# Patient Record
Sex: Female | Born: 1935 | Race: White | Hispanic: No | Marital: Married | Smoking: Never smoker
Health system: Southern US, Community
[De-identification: ages and names within clinical notes are randomized; demographics above are authoritative.]

## PROBLEM LIST (undated history)

## (undated) DIAGNOSIS — I10 Essential (primary) hypertension: Secondary | ICD-10-CM

## (undated) DIAGNOSIS — E079 Disorder of thyroid, unspecified: Secondary | ICD-10-CM

## (undated) DIAGNOSIS — E039 Hypothyroidism, unspecified: Secondary | ICD-10-CM

## (undated) DIAGNOSIS — K219 Gastro-esophageal reflux disease without esophagitis: Secondary | ICD-10-CM

## (undated) DIAGNOSIS — F419 Anxiety disorder, unspecified: Secondary | ICD-10-CM

## (undated) DIAGNOSIS — M1990 Unspecified osteoarthritis, unspecified site: Secondary | ICD-10-CM

## (undated) DIAGNOSIS — E7800 Pure hypercholesterolemia, unspecified: Secondary | ICD-10-CM

## (undated) DIAGNOSIS — I6529 Occlusion and stenosis of unspecified carotid artery: Secondary | ICD-10-CM

## (undated) DIAGNOSIS — I639 Cerebral infarction, unspecified: Secondary | ICD-10-CM

## (undated) HISTORY — PX: COLONOSCOPY: SHX174

## (undated) HISTORY — DX: Occlusion and stenosis of unspecified carotid artery: I65.29

## (undated) HISTORY — PX: ROTATOR CUFF REPAIR: SHX139

## (undated) HISTORY — PX: MENISCUS REPAIR: SHX5179

## (undated) HISTORY — PX: APPENDECTOMY: SHX54

## (undated) HISTORY — DX: Cerebral infarction, unspecified: I63.9

---

## 2001-09-02 ENCOUNTER — Encounter (HOSPITAL_COMMUNITY): Admission: RE | Admit: 2001-09-02 | Discharge: 2001-10-02 | Payer: Self-pay | Admitting: Oncology

## 2001-09-02 ENCOUNTER — Encounter: Admission: RE | Admit: 2001-09-02 | Discharge: 2001-09-02 | Payer: Self-pay | Admitting: Oncology

## 2001-11-30 ENCOUNTER — Encounter (HOSPITAL_COMMUNITY): Admission: RE | Admit: 2001-11-30 | Discharge: 2001-12-30 | Payer: Self-pay | Admitting: Oncology

## 2002-03-24 ENCOUNTER — Encounter: Admission: RE | Admit: 2002-03-24 | Discharge: 2002-03-24 | Payer: Self-pay | Admitting: Oncology

## 2002-03-24 ENCOUNTER — Encounter (HOSPITAL_COMMUNITY): Admission: RE | Admit: 2002-03-24 | Discharge: 2002-04-23 | Payer: Self-pay | Admitting: Oncology

## 2002-09-28 ENCOUNTER — Encounter (HOSPITAL_COMMUNITY): Admission: RE | Admit: 2002-09-28 | Discharge: 2002-10-07 | Payer: Self-pay | Admitting: Oncology

## 2002-09-28 ENCOUNTER — Encounter: Admission: RE | Admit: 2002-09-28 | Discharge: 2002-09-28 | Payer: Self-pay | Admitting: Oncology

## 2003-10-04 ENCOUNTER — Ambulatory Visit (HOSPITAL_COMMUNITY): Admission: RE | Admit: 2003-10-04 | Discharge: 2003-10-04 | Payer: Self-pay

## 2004-11-26 ENCOUNTER — Ambulatory Visit: Payer: Self-pay | Admitting: Internal Medicine

## 2004-11-26 ENCOUNTER — Ambulatory Visit (HOSPITAL_COMMUNITY): Admission: RE | Admit: 2004-11-26 | Discharge: 2004-11-26 | Payer: Self-pay | Admitting: Internal Medicine

## 2005-07-22 ENCOUNTER — Ambulatory Visit (HOSPITAL_COMMUNITY): Admission: RE | Admit: 2005-07-22 | Discharge: 2005-07-22 | Payer: Self-pay | Admitting: Pulmonary Disease

## 2005-08-14 ENCOUNTER — Ambulatory Visit (HOSPITAL_COMMUNITY): Admission: RE | Admit: 2005-08-14 | Discharge: 2005-08-14 | Payer: Self-pay | Admitting: Pulmonary Disease

## 2005-08-20 ENCOUNTER — Ambulatory Visit (HOSPITAL_COMMUNITY): Admission: RE | Admit: 2005-08-20 | Discharge: 2005-08-20 | Payer: Self-pay | Admitting: Pulmonary Disease

## 2008-05-31 ENCOUNTER — Ambulatory Visit (HOSPITAL_COMMUNITY): Admission: RE | Admit: 2008-05-31 | Discharge: 2008-05-31 | Payer: Self-pay | Admitting: Orthopaedic Surgery

## 2008-07-13 ENCOUNTER — Encounter (HOSPITAL_COMMUNITY): Admission: RE | Admit: 2008-07-13 | Discharge: 2008-08-12 | Payer: Self-pay | Admitting: Orthopaedic Surgery

## 2009-06-08 ENCOUNTER — Ambulatory Visit (HOSPITAL_COMMUNITY): Admission: RE | Admit: 2009-06-08 | Discharge: 2009-06-08 | Payer: Self-pay | Admitting: Pulmonary Disease

## 2010-05-25 NOTE — Op Note (Signed)
NAME:  Sarah Nelson, Sarah Nelson           ACCOUNT NO.:  [id number]   MEDICAL RECORD NO.:  [id number]          PATIENT TYPE:  AMB   LOCATION:  DAY                           FACILITY:  [facility]   PHYSICIAN:  R. Roetta Sessions, M.D. DATE OF BIRTH:  1935-06-28   DATE OF PROCEDURE:  11/26/2004  DATE OF DISCHARGE:                                 OPERATIVE REPORT   PROCEDURE:  Screening colonoscopy.   INDICATIONS FOR PROCEDURE:  The patient is a 75 year old lady sent over at  the courtesy of Dr. Juanetta Gosling for colorectal cancer screening. She had a  sigmoidoscopy in 2001 with negative findings. She is devoid of any lower GI  tract symptoms. She has never had her entire lower GI tract imaged. There is  no family history of colorectal cancer in any first degree relatives  although she does have a first colon reportedly with colorectal cancer.  Colonoscopy is now being done as a standard screening maneuver. This  approach has been discussed with the patient at length. Potential risks,  benefits, and alternatives have been reviewed and questions answered. She is  agreeable. Please see documentation in the medical record.   PROCEDURE NOTE:  O2 saturation, blood pressure, pulse, and respirations were  monitored throughout the entire procedure. Conscious sedation with Versed 3  mg IV and Demerol 75 mg IV in divided doses.   INSTRUMENT:  Olympus video chip system.   FINDINGS:  Digital rectal exam revealed no abnormalities.   ENDOSCOPIC FINDINGS:  Prep was good.   Rectum:  Examination of the rectal mucosa including retroflexed view of the  anal verge revealed only a couple of internal hemorrhoidal tags. Rectal  mucosa otherwise appeared normal.   Colon:  Colonic mucosa was surveyed from the rectosigmoid junction through  the left, transverse, and right colon to the area of the appendiceal  orifice, ileocecal valve, and cecum. These structures were well seen and  photographed for the record. From this level,  the scope was slowly  withdrawn, and all previously mentioned mucosal surfaces were again seen.  The patient had a few scattered left colon diverticula. The remainder of the  colonic mucosa appeared normal. The patient tolerated the procedure well and  was reactive to endoscopy.   IMPRESSION:  1.  Internal hemorrhoids. Otherwise normal rectum.  2.  Few scattered left sided diverticula. The remainder of the colonic      mucosa appeared normal.   RECOMMENDATIONS:  1.  Diverticulosis literature given to [name].  2.  Repeat screening colonoscopy in 10 years.      Jonathon Bellows, M.D.  Electronically Signed     RMR/MEDQ  D:  11/26/2004  T:  11/26/2004  Job:  [id number]   cc:   Ramon Dredge L. Juanetta Gosling, M.D.  Fax: [phone]

## 2012-11-19 ENCOUNTER — Other Ambulatory Visit (INDEPENDENT_AMBULATORY_CARE_PROVIDER_SITE_OTHER): Payer: Self-pay | Admitting: Otolaryngology

## 2012-11-19 DIAGNOSIS — H905 Unspecified sensorineural hearing loss: Secondary | ICD-10-CM

## 2012-11-23 ENCOUNTER — Ambulatory Visit (HOSPITAL_COMMUNITY)
Admission: RE | Admit: 2012-11-23 | Discharge: 2012-11-23 | Disposition: A | Discharge status: Home / Self Care | Payer: Medicare Other | Source: Ambulatory Visit | Attending: Otolaryngology | Admitting: Otolaryngology

## 2012-11-23 DIAGNOSIS — H905 Unspecified sensorineural hearing loss: Secondary | ICD-10-CM

## 2012-11-23 DIAGNOSIS — R42 Dizziness and giddiness: Secondary | ICD-10-CM | POA: Insufficient documentation

## 2012-11-23 DIAGNOSIS — H9190 Unspecified hearing loss, unspecified ear: Secondary | ICD-10-CM | POA: Insufficient documentation

## 2012-11-23 MED ORDER — GADOBENATE DIMEGLUMINE 529 MG/ML IV SOLN
7.0000 mL | Freq: Once | INTRAVENOUS | Status: AC | PRN
  Administered 2012-11-23: 7 mL via INTRAVENOUS

## 2012-11-24 LAB — POCT I-STAT, CHEM 8
Chloride: 103 mEq/L (ref 96–112)
HCT: 45 % (ref 36.0–46.0)
Potassium: 3.8 mEq/L (ref 3.5–5.1)
TCO2: 27 mmol/L (ref 0–100)

## 2012-12-17 ENCOUNTER — Encounter (INDEPENDENT_AMBULATORY_CARE_PROVIDER_SITE_OTHER): Payer: Self-pay

## 2012-12-17 ENCOUNTER — Ambulatory Visit (INDEPENDENT_AMBULATORY_CARE_PROVIDER_SITE_OTHER): Payer: Medicare Other | Admitting: Otolaryngology

## 2012-12-17 DIAGNOSIS — H905 Unspecified sensorineural hearing loss: Secondary | ICD-10-CM

## 2012-12-17 DIAGNOSIS — R42 Dizziness and giddiness: Secondary | ICD-10-CM

## 2012-12-17 DIAGNOSIS — H8110 Benign paroxysmal vertigo, unspecified ear: Secondary | ICD-10-CM

## 2013-01-14 ENCOUNTER — Ambulatory Visit (INDEPENDENT_AMBULATORY_CARE_PROVIDER_SITE_OTHER): Payer: Managed Care, Other (non HMO) | Admitting: Otolaryngology

## 2013-01-14 DIAGNOSIS — H8110 Benign paroxysmal vertigo, unspecified ear: Secondary | ICD-10-CM

## 2014-10-18 DIAGNOSIS — H52203 Unspecified astigmatism, bilateral: Secondary | ICD-10-CM | POA: Diagnosis not present

## 2014-10-18 DIAGNOSIS — H524 Presbyopia: Secondary | ICD-10-CM | POA: Diagnosis not present

## 2014-10-19 ENCOUNTER — Telehealth: Payer: Self-pay | Admitting: Internal Medicine

## 2014-10-19 NOTE — Telephone Encounter (Signed)
NOV RECALL FOR TCS °

## 2014-10-19 NOTE — Telephone Encounter (Signed)
Letter mailed to pt.  

## 2014-11-10 DIAGNOSIS — R69 Illness, unspecified: Secondary | ICD-10-CM | POA: Diagnosis not present

## 2015-07-18 DIAGNOSIS — R69 Illness, unspecified: Secondary | ICD-10-CM | POA: Diagnosis not present

## 2015-07-24 DIAGNOSIS — E785 Hyperlipidemia, unspecified: Secondary | ICD-10-CM | POA: Diagnosis not present

## 2015-07-24 DIAGNOSIS — E039 Hypothyroidism, unspecified: Secondary | ICD-10-CM | POA: Diagnosis not present

## 2015-07-24 DIAGNOSIS — R42 Dizziness and giddiness: Secondary | ICD-10-CM | POA: Diagnosis not present

## 2015-08-03 DIAGNOSIS — Z0000 Encounter for general adult medical examination without abnormal findings: Secondary | ICD-10-CM | POA: Diagnosis not present

## 2015-08-08 DIAGNOSIS — Z1211 Encounter for screening for malignant neoplasm of colon: Secondary | ICD-10-CM | POA: Diagnosis not present

## 2015-10-30 ENCOUNTER — Other Ambulatory Visit (HOSPITAL_COMMUNITY): Payer: Self-pay | Admitting: Pulmonary Disease

## 2015-10-30 ENCOUNTER — Ambulatory Visit (HOSPITAL_COMMUNITY)
Admission: RE | Admit: 2015-10-30 | Discharge: 2015-10-30 | Disposition: A | Discharge status: Home / Self Care | Payer: Medicare HMO | Source: Ambulatory Visit | Attending: Pulmonary Disease | Admitting: Pulmonary Disease

## 2015-10-30 DIAGNOSIS — M545 Low back pain: Secondary | ICD-10-CM

## 2015-10-30 DIAGNOSIS — I700 Atherosclerosis of aorta: Secondary | ICD-10-CM | POA: Insufficient documentation

## 2015-11-02 DIAGNOSIS — I739 Peripheral vascular disease, unspecified: Secondary | ICD-10-CM | POA: Diagnosis not present

## 2015-11-02 DIAGNOSIS — M545 Low back pain: Secondary | ICD-10-CM | POA: Diagnosis not present

## 2015-11-02 DIAGNOSIS — E785 Hyperlipidemia, unspecified: Secondary | ICD-10-CM | POA: Diagnosis not present

## 2015-11-02 DIAGNOSIS — Z23 Encounter for immunization: Secondary | ICD-10-CM | POA: Diagnosis not present

## 2015-11-21 DIAGNOSIS — H524 Presbyopia: Secondary | ICD-10-CM | POA: Diagnosis not present

## 2015-11-21 DIAGNOSIS — H2513 Age-related nuclear cataract, bilateral: Secondary | ICD-10-CM | POA: Diagnosis not present

## 2015-11-21 DIAGNOSIS — H1859 Other hereditary corneal dystrophies: Secondary | ICD-10-CM | POA: Diagnosis not present

## 2015-11-21 DIAGNOSIS — H43813 Vitreous degeneration, bilateral: Secondary | ICD-10-CM | POA: Diagnosis not present

## 2016-01-08 HISTORY — PX: OTHER SURGICAL HISTORY: SHX169

## 2016-05-10 ENCOUNTER — Other Ambulatory Visit (HOSPITAL_COMMUNITY): Payer: Self-pay | Admitting: Orthopedic Surgery

## 2016-05-10 DIAGNOSIS — M25561 Pain in right knee: Secondary | ICD-10-CM

## 2016-05-14 ENCOUNTER — Ambulatory Visit (HOSPITAL_COMMUNITY)
Admission: RE | Admit: 2016-05-14 | Discharge: 2016-05-14 | Disposition: A | Discharge status: Home / Self Care | Payer: Medicare HMO | Source: Ambulatory Visit | Attending: Orthopedic Surgery | Admitting: Orthopedic Surgery

## 2016-05-14 DIAGNOSIS — M25561 Pain in right knee: Secondary | ICD-10-CM | POA: Insufficient documentation

## 2016-05-14 DIAGNOSIS — X58XXXA Exposure to other specified factors, initial encounter: Secondary | ICD-10-CM | POA: Diagnosis not present

## 2016-05-14 DIAGNOSIS — S83241A Other tear of medial meniscus, current injury, right knee, initial encounter: Secondary | ICD-10-CM | POA: Insufficient documentation

## 2016-05-14 DIAGNOSIS — M7121 Synovial cyst of popliteal space [Baker], right knee: Secondary | ICD-10-CM | POA: Insufficient documentation

## 2016-05-14 DIAGNOSIS — M25461 Effusion, right knee: Secondary | ICD-10-CM | POA: Insufficient documentation

## 2016-05-27 ENCOUNTER — Ambulatory Visit (HOSPITAL_COMMUNITY): Payer: Medicare HMO | Attending: Orthopedic Surgery | Admitting: Physical Therapy

## 2016-05-27 DIAGNOSIS — M25661 Stiffness of right knee, not elsewhere classified: Secondary | ICD-10-CM | POA: Diagnosis not present

## 2016-05-27 DIAGNOSIS — M25561 Pain in right knee: Secondary | ICD-10-CM | POA: Diagnosis present

## 2016-05-27 NOTE — Therapy (Signed)
[facility] [address] [place], [place], [address] Phone: [phone]   Fax:  [phone]  Physical Therapy Evaluation  Patient Details  Name: Sarah Nelson MRN: [id number] Date of Birth: [birth year] Referring Provider: Darcella CheshireSteven Lucey   Encounter Date: 05/27/2016      Sarah End of Session - 05/27/16 1420    Visit Number 1   Number of Visits 4   Date for Sarah Re-Evaluation 06/11/16   Authorization Type Aetna medicare   Authorization - Visit Number 1   Authorization - Number of Visits 4   Sarah Start Time 1345   Sarah Stop Time 1420   Sarah Time Calculation (min) 35 min   Activity Tolerance Patient tolerated treatment well   Behavior During Therapy [facility] for tasks assessed/performed      No past medical history on file.  No past surgical history on file.  There were no vitals filed for this visit.       Subjective Assessment - 05/27/16 1348    Subjective Sarah Nelson states that she injured her knee playing tennis in February; she opted to have arthroscopic knee surgery on her right side on 05/20/2016.  She states that she does not feel much different; she is still having the pain with sit to stand and if she turns and going up and down steps.  She is now being referred to skilled physical therapy to maximize her functional ability.    Pertinent History unremarkable    How long can you sit comfortably? no problem    How long can you stand comfortably? 20 minutes    How long can you walk comfortably? 10 minutes at a time.    Patient Stated Goals to play tennis again    Currently in Pain? No/denies  sit to stand pain is a 5/10    Aggravating Factors  she has pain with sit to stand and steps    Pain Relieving Factors ice    Effect of Pain on Daily Activities increases             [facility] Assessment - 05/27/16 0001      Assessment   Medical Diagnosis Rt knee arthroscopic surgery    Referring Provider Darcella CheshireSteven Lucey    Onset  Date/Surgical Date 05/20/16   Next MD Visit 06/18/2016   Prior Therapy no     Precautions   Precautions None     Restrictions   Weight Bearing Restrictions No     Balance Screen   Has the patient fallen in the past 6 months No   Has the patient had a decrease in activity level because of a fear of falling?  Yes   Is the patient reluctant to leave their home because of a fear of falling?  No     Home [employer] information [facility] managernvironment   Living Environment Private residence     Prior Function   Level of Independence Independent   Vocation Retired   Leisure tennis     Cognition   Overall Cognitive Status Within Functional Limits for tasks assessed     Observation/Other Assessments   Focus on Therapeutic Outcomes (FOTO)  42     Functional Tests   Functional tests Single leg stance;Sit to Stand     Single Leg Stance   Comments Lt: 3 seconds: Rt 19 seconds      Sit to Stand   Comments 5 x 26.48 pain with initial sit to stand but none after.  ROM / Strength   AROM / PROM / Strength AROM;Strength     AROM   AROM Assessment Site Knee   Right/Left Knee Right   Right Knee Extension 0   Right Knee Flexion 120     Strength   Strength Assessment Site Hip;Knee;Ankle   Right/Left Hip Right   Right Hip Flexion 5/5   Right Hip Extension 5/5   Right Hip ABduction 5/5   Right/Left Knee Right   Right Knee Flexion 5/5   Right Knee Extension 5/5   Right/Left Ankle Right   Right Ankle Dorsiflexion 4/5   Right Ankle Plantar Flexion 4/5     Flexibility   Soft Tissue Assessment /Muscle Length yes   Hamstrings Rt 145; LT 160                   [facility] Adult Sarah Treatment/Exercise - 05/27/16 0001      Exercises   Exercises Knee/Hip     Knee/Hip Exercises: Stretches   Active Hamstring Stretch Both;3 reps;30 seconds     Knee/Hip Exercises: Standing   Heel Raises Both;10 reps   Functional Squat 10 reps                Sarah Education - 05/27/16 1419    Education provided Yes    Education Details HEP   Person(s) Educated Patient   Methods Explanation;Handout;Verbal cues   Comprehension Verbalized understanding;Returned demonstration          Sarah Short Term Goals - 05/27/16 1427      Sarah SHORT TERM GOAL #1   Title Sarah pain level iin her right knee  to be no greater than a 3/10 to allow Sarah to feel comfortable being up for an hour.    Time 1   Period Weeks   Status New     Sarah SHORT TERM GOAL #2   Title Sarah to be able to come sit to stand from the couch without increase pain in her right knee   Time 1   Period Weeks     Sarah SHORT TERM GOAL #3   Title Sarah to be able to ascend and descend steps without increased right knee pain    Time 10   Period Days   Status New           Sarah Long Term Goals - 05/27/16 1429      Sarah LONG TERM GOAL #1   Title Sarah to be able to go to the tennis courts and volley balls for 45 minutes without having increased right knee pain.    Time 2   Period Weeks   Status New     Sarah LONG TERM GOAL #2   Title Sarah to be able to be up on her feet standing/walking for two hours without experiencing increased right knee pain.    Time 2   Period Weeks   Status New               Plan - 05/27/16 1421    Clinical Impression Statement Sarah Nelson is a 81 yo female who injured her right knee while playing tennis in February.  Her knee pain did not improve therefore she opted to have arthroscopic surgery on 05/20/2016.  At this time her functional level remains the same therefore she has been referred to skilled outpatient physical therapy.  Examination demonstrates decreased ROM, decreased balance, decreased flexibility, decreased strength and increased pain.  Sarah Nelson will benefit from skilled therapy  to address these issues and maximize her functional ability.     Rehab Potential Good   Sarah Frequency 2x / week   Sarah Duration 2 weeks   Sarah Treatment/Interventions ADLs/Self Care Home Management;Therapeutic activities;Functional  mobility training;Stair training;Gait training;Therapeutic exercise;Balance training;Patient/family education;Passive range of motion;Manual techniques   Sarah Next Visit Plan Begin rockerboard, sit to stand, forward and lateral step ups and single leg stance.  Progress to steps, vector stances .  Update HEP each visit.    Sarah Home Exercise Plan heel raises, functional squat; supine hamstring stretch,       Patient will benefit from skilled therapeutic intervention in order to improve the following deficits and impairments:  Decreased activity tolerance, Decreased balance, Decreased strength, Decreased range of motion, Difficulty walking, Impaired flexibility, Pain  Visit Diagnosis: Stiffness of right knee, not elsewhere classified  Acute pain of right knee      G-Codes - 2016-06-21 1431    Functional Assessment Tool Used (Outpatient Only) foto   Functional Limitation Mobility: Walking and moving around   Mobility: Walking and Moving Around Current Status [phone] At least 40 percent but less than 60 percent impaired, limited or restricted   Mobility: Walking and Moving Around Goal Status [phone]) At least 20 percent but less than 40 percent impaired, limited or restricted       Problem List There are no active problems to display for this patient.   Sarah Nelson, Sarah Nelson [phone] 06-21-16, 2:32 PM  [facility] [address] [place], [place], [address] Phone: [phone]   Fax:  [phone]  Name: Sarah Nelson MRN: [id number] Date of Birth: 17-May-1935

## 2016-05-27 NOTE — Patient Instructions (Addendum)
Stretching: Hamstring (Supine)    Supporting right thigh behind knee, slowly straighten knee until stretch is felt in back of thigh. Hold 30___ seconds. Repeat __3__ times per set. Do _1___ sets per session. Do _1-2___ sessions per day.  http://orth.exer.us/656   Copyright  VHI. All rights reserved.  Self-Mobilization: Heel Slide (Supine)    Slide left heel toward buttocks until a gentle stretch is felt. Hold __3-5__ seconds. Relax. Repeat __5-10__ times per set. Do _1___ sets per session. Do _2___ sessions per day.  http://orth.exer.us/710   Copyright  VHI. All rights reserved.  Heel Raise: Bilateral (Standing)    Rise on balls of feet. Repeat __10__ times per set. Do __1__ sets per session. Do ___2_ sessions per day.  http://orth.exer.us/38   Copyright  VHI. All rights reserved.  Functional Quadriceps: Chair Squat    Keeping feet flat on floor, shoulder width apart, squat as low as is comfortable. Use support as necessary. Repeat __10__ times per set. Do _1___ sets per session. Do __2__ sessions per day.  http://orth.exer.us/736   Copyright  VHI. All rights reserved.

## 2016-05-30 ENCOUNTER — Ambulatory Visit (HOSPITAL_COMMUNITY): Payer: Medicare HMO

## 2016-05-30 DIAGNOSIS — M25561 Pain in right knee: Secondary | ICD-10-CM

## 2016-05-30 DIAGNOSIS — M25661 Stiffness of right knee, not elsewhere classified: Secondary | ICD-10-CM

## 2016-05-30 NOTE — Therapy (Signed)
[facility] [address] [place], [place], [address] Phone: [phone]   Fax:  [phone]  Physical Therapy Treatment  Patient Details  Name: Sarah Nelson MRN: [id number] Date of Birth: 12/20/1935 Referring Provider: Darcella Cheshire   Encounter Date: 05/30/2016      PT End of Session - 05/30/16 1350    Visit Number 2   Number of Visits 4   Date for PT Re-Evaluation 06/11/16   Authorization Type Aetna medicare   Authorization - Visit Number 2   Authorization - Number of Visits 4   PT Start Time 1345   PT Stop Time 1433   PT Time Calculation (min) 48 min   Activity Tolerance Patient tolerated treatment well;No increased pain   Behavior During Therapy [facility] for tasks assessed/performed      No past medical history on file.  No past surgical history on file.  There were no vitals filed for this visit.      Subjective Assessment - 05/30/16 1349    Subjective Pt reports no reports of pain, mainly stiffness.  Reports complaince wiht HEP without difficulty.   Pertinent History unremarkable    Patient Stated Goals to play tennis again    Currently in Pain? No/denies                         [facility] Adult PT Treatment/Exercise - 05/30/16 0001      Knee/Hip Exercises: Stretches   Active Hamstring Stretch Both;3 reps;30 seconds   Active Hamstring Stretch Limitations supine with towel     Knee/Hip Exercises: Standing   Heel Raises Both;2 sets;10 reps   Lateral Step Up Both;10 reps;Hand Hold: 1;Step Height: 4";2 sets;Step Height: 6"   Lateral Step Up Limitations 4in then attempted 6in   Forward Step Up Both;10 reps;Hand Hold: 0;Step Height: 6"   Functional Squat 10 reps   Functional Squat Limitations cueing for form   Rocker Board 2 minutes   Rocker Board Limitations lateral and DF/PF   SLS Lt 13", Rt 14" max of 3     Knee/Hip Exercises: Seated   Sit to Sand 10 reps;without UE support;5 reps  no HHA,  cueing for eccentric control from 19.5in and 16.25in     Knee/Hip Exercises: Supine   Heel Slides 10 reps   Heel Slides Limitations reviewed form with HEP                PT Education - 05/30/16 1358    Education provided Yes   Education Details Reviewed goals, assured complaince and proper technique wiht HEP and copy of eval given to pt   Person(s) Educated Patient   Methods Explanation;Demonstration;Handout   Comprehension Verbalized understanding;Returned demonstration;Verbal cues required          PT Short Term Goals - 05/27/16 1427      PT SHORT TERM GOAL #1   Title PT pain level iin her right knee  to be no greater than a 3/10 to allow pt to feel comfortable being up for an hour.    Time 1   Period Weeks   Status New     PT SHORT TERM GOAL #2   Title Pt to be able to come sit to stand from the couch without increase pain in her right knee   Time 1   Period Weeks     PT SHORT TERM GOAL #3   Title Pt to be able to ascend and  descend steps without increased right knee pain    Time 10   Period Days   Status New           PT Long Term Goals - 05/27/16 1429      PT LONG TERM GOAL #1   Title Pt to be able to go to the tennis courts and volley balls for 45 minutes without having increased right knee pain.    Time 2   Period Weeks   Status New     PT LONG TERM GOAL #2   Title Pt to be able to be up on her feet standing/walking for two hours without experiencing increased right knee pain.    Time 2   Period Weeks   Status New               Plan - 05/30/16 1444    Clinical Impression Statement Reviewed goals, assured compliance and proper form with HEP and copy of eval given to pt.  Progressed CKC exercises for functional strengthening, pt able to demonstrate good form and techniques with min cueing for appropriate weight bearing with squats, able to demonstrate proper form following cueing.  Pt given additional HEP for functional strengthening  including eccentric sit to stand, forward step ups and SLS.  Pt unable to complete lateral step ups without compensation with increased height, not given as HEP this session.  No reports of pain through session.  Reviewed RICE technqiues to address increased pain and edema following prolonged standing and therex, verbally understood.     Rehab Potential Good   PT Frequency 2x / week   PT Duration 2 weeks   PT Treatment/Interventions ADLs/Self Care Home Management;Therapeutic activities;Functional mobility training;Stair training;Gait training;Therapeutic exercise;Balance training;Patient/family education;Passive range of motion;Manual techniques   PT Next Visit Plan Next session continue wiht SLS and lateral step ups, increase height as able, begin step down next session.  Progress to reciprocal stair training and vector stance.  Update HEP each visit.     PT Home Exercise Plan heel raises, functional squat; supine hamstring stretch; sit to stand, step up and SLS.      Patient will benefit from skilled therapeutic intervention in order to improve the following deficits and impairments:  Decreased activity tolerance, Decreased balance, Decreased strength, Decreased range of motion, Difficulty walking, Impaired flexibility, Pain  Visit Diagnosis: Stiffness of right knee, not elsewhere classified  Acute pain of right knee     Problem List There are no active problems to display for this patient.  [address] Rockaway StreetCasey Cockerham, LPTA; CBIS [phone]  Juel BurrowCockerham, Casey Jo 05/30/2016, 2:52 PM  [facility] [address] [place], [place], [address] Phone: [phone]   Fax:  [phone]  Name: Laurice RecordHazel D Hoogendoorn MRN: [id number] Date of Birth: 1935-03-13

## 2016-05-30 NOTE — Patient Instructions (Addendum)
Functional Quadriceps: Sit to Stand    Sit on edge of chair, feet flat on floor. Stand upright, extending knees fully. Repeat 10 times per set. Do 2 sets per session.   http://orth.exer.us/735   Copyright  VHI. All rights reserved.   Step: Up, Anterior    Stand facing step. Place involved leg up. Raise body using top leg only. Step down backward, involved leg first, lower body using other leg. Repeat 10 times per set. Do 2 sets per session.   Copyright  VHI. All rights reserved.   Single Leg Balance: Eyes Open    Stand on right leg with eyes open. Hold 30-60 seconds. 5 reps 2 times per day.  http://ggbe.exer.us/5   Copyright  VHI. All rights reserved.

## 2016-06-04 ENCOUNTER — Ambulatory Visit (HOSPITAL_COMMUNITY): Payer: Medicare HMO

## 2016-06-04 DIAGNOSIS — M25561 Pain in right knee: Secondary | ICD-10-CM

## 2016-06-04 DIAGNOSIS — M25661 Stiffness of right knee, not elsewhere classified: Secondary | ICD-10-CM | POA: Diagnosis not present

## 2016-06-04 NOTE — Patient Instructions (Addendum)
Step: Up, Lateral    Stand with side toward step. Place involved leg up. Raise body using top leg only. Step off other side, involved leg first, lower body using other leg. Repeat 10 times per set. Do 2 sets per session.  Copyright  VHI. All rights reserved.   Climbing Stairs    When climbing stairs, breathe in with first step, breathe out through pursed lips with two or three steps. If you are more short of breath than usual: Breathe in while standing still. Breathe out through pursed lips while stepping up. Stop, then repeat with each step. Take only one step at a time. Do not hold your breath when climbing steps.  Copyright  VHI. All rights reserved.

## 2016-06-04 NOTE — Therapy (Signed)
[facility] [address] [place], [place], [address] Phone: [phone]   Fax:  [phone]  Physical Therapy Treatment  Patient Details  Name: Sarah Nelson MRN: [id number] Date of Birth: 1935/12/23 Referring Provider: Darcella CheshireSteven Lucey   Encounter Date: 06/04/2016      PT End of Session - 06/04/16 1353    Visit Number 3   Number of Visits 4   Date for PT Re-Evaluation 06/11/16   Authorization Type Aetna medicare   Authorization - Visit Number 3   Authorization - Number of Visits 4   PT Start Time 1350   PT Stop Time 1428   PT Time Calculation (min) 38 min   Activity Tolerance Patient tolerated treatment well;No increased pain   Behavior During Therapy [facility] for tasks assessed/performed      No past medical history on file.  No past surgical history on file.  There were no vitals filed for this visit.      Subjective Assessment - 06/04/16 1351    Subjective No reports of pain, mainly stiffness.  Reports compliance wiht HEP daily, stated ability to SLS 30" this morning   Pertinent History unremarkable    Patient Stated Goals to play tennis again    Currently in Pain? No/denies                         [facility] Adult PT Treatment/Exercise - 06/04/16 0001      Knee/Hip Exercises: Stretches   Active Hamstring Stretch --   Active Hamstring Stretch Limitations 3   Gastroc Stretch 2 reps;30 seconds     Knee/Hip Exercises: Standing   Heel Raises 20 reps   Heel Raises Limitations toe raises   Forward Lunges 10 reps;Both   Forward Lunges Limitations on floor   Lateral Step Up Right;15 reps;Hand Hold: 2;Step Height: 6"   Lateral Step Up Limitations 6in step height   Forward Step Up 15 reps;Hand Hold: 1;Hand Hold: 0;Step Height: 6"   Step Down 10 reps;Hand Hold: 1;Step Height: 4"   Step Down Limitations 4in step   Functional Squat 10 reps   Functional Squat Limitations cueing for form   Rocker Board 2 minutes    Rocker Board Limitations lateral and DF/PF minimal HHA   SLS Rt 27", Lt 30" max of 3   Other Standing Knee Exercises sidestep RTB 2RT                  PT Short Term Goals - 05/27/16 1427      PT SHORT TERM GOAL #1   Title PT pain level iin her right knee  to be no greater than a 3/10 to allow pt to feel comfortable being up for an hour.    Time 1   Period Weeks   Status New     PT SHORT TERM GOAL #2   Title Pt to be able to come sit to stand from the couch without increase pain in her right knee   Time 1   Period Weeks     PT SHORT TERM GOAL #3   Title Pt to be able to ascend and descend steps without increased right knee pain    Time 10   Period Days   Status New           PT Long Term Goals - 05/27/16 1429      PT LONG TERM GOAL #1   Title Pt to be  able to go to the tennis courts and volley balls for 45 minutes without having increased right knee pain.    Time 2   Period Weeks   Status New     PT LONG TERM GOAL #2   Title Pt to be able to be up on her feet standing/walking for two hours without experiencing increased right knee pain.    Time 2   Period Weeks   Status New               Plan - 06/04/16 1424    Clinical Impression Statement Pt progressing well towards goals with reports compliance iwth HEP and pain free.  Progressed functional strengthening, added step down and reciprocal stair training.  Pt able to complete all with minimal cueing for control.  Pt given advanced HEP to begin stair training and lateral step ups.  Improved SLS noted today.  No reorts of increased pain through session.   Rehab Potential Good   PT Frequency 2x / week   PT Duration 2 weeks   PT Treatment/Interventions ADLs/Self Care Home Management;Therapeutic activities;Functional mobility training;Stair training;Gait training;Therapeutic exercise;Balance training;Patient/family education;Passive range of motion;Manual techniques   PT Next Visit Plan reassess next  session.  Update HEP with vector stance and functional strengthening.   PT Home Exercise Plan heel raises, functional squat; supine hamstring stretch; sit to stand, step up and SLS.; lateral step up and reciprocal stair training       Patient will benefit from skilled therapeutic intervention in order to improve the following deficits and impairments:  Decreased activity tolerance, Decreased balance, Decreased strength, Decreased range of motion, Difficulty walking, Impaired flexibility, Pain  Visit Diagnosis: Stiffness of right knee, not elsewhere classified  Acute pain of right knee     Problem List There are no active problems to display for this patient.  [address] LPTA; CBIS [phone]  Juel Burrow 06/04/2016, 2:30 PM  [facility] [address] [place], [place], [address] Phone: [phone]   Fax:  [phone]  Name: Sarah Nelson MRN: [id number] Date of Birth: May 18, 1935

## 2016-06-07 ENCOUNTER — Ambulatory Visit (HOSPITAL_COMMUNITY): Payer: Medicare HMO | Attending: Orthopedic Surgery | Admitting: Physical Therapy

## 2016-06-07 DIAGNOSIS — M25561 Pain in right knee: Secondary | ICD-10-CM | POA: Diagnosis present

## 2016-06-07 DIAGNOSIS — M25661 Stiffness of right knee, not elsewhere classified: Secondary | ICD-10-CM | POA: Diagnosis present

## 2016-06-07 NOTE — Therapy (Signed)
Sarah Nelson, [place], [address] Phone: [phone]   Fax:  [phone]  Physical Therapy Treatment  Patient Details  Name: Sarah Nelson MRN: [id number] Date of Birth: 12/13/1935 Referring Provider: Harden Mo   Encounter Date: 06/07/2016      PT End of Session - 06/07/16 0903    Visit Number 4   Number of Visits 4   Date for PT Re-Evaluation 06/11/16   Authorization Type [employer] medicare   Authorization - Visit Number 4   Authorization - Number of Visits 4   PT Start Time 0815   PT Stop Time 0900   PT Time Calculation (min) 45 min   Activity Tolerance Patient tolerated treatment well;No increased pain   Behavior During Therapy [facility] for tasks assessed/performed      No past medical history on file.  No past surgical history on file.  There were no vitals filed for this visit.      Subjective Assessment - 06/07/16 0819    Subjective Sarah Nelson states that she can climb steps but it is still difficult.  Overall, however, her Rt  knee is feeling better.    Pertinent History unremarkable    How long can you sit comfortably? no problem    How long can you stand comfortably? 60 minutes now 20 minutes    How long can you walk comfortably? 60 minutes was 10 minutes at a time.    Patient Stated Goals to play tennis again    Currently in Pain? No/denies            [facility] PT Assessment - 06/07/16 0001      Assessment   Medical Diagnosis Rt knee arthroscopic surgery    Referring Provider Harden Mo    Onset Date/Surgical Date 05/20/16   Next MD Visit 06/18/2016   Prior Therapy no     Precautions   Precautions None     Restrictions   Weight Bearing Restrictions No     Balance Screen   Has the patient fallen in the past 6 months No   Has the patient had a decrease in activity level because of a fear of falling?  No   Is the patient reluctant to leave their home because of a fear of  falling?  No     Home Ecologist residence     Prior Function   Level of Independence Independent   Vocation Retired   Leisure Acupuncturist   Overall Cognitive Status Within Functional Limits for tasks assessed     Observation/Other Assessments   Focus on Therapeutic Outcomes (FOTO)  68  was 42     Functional Tests   Functional tests Single leg stance;Sit to Stand     Single Leg Stance   Comments Lt: 50 second was 3 seconds: Rt 60 seconds was 19 seconds      Sit to Stand   Comments 5 x 11.46 was 26.48 pain with initial sit to stand but none after.      AROM   Right Knee Extension 0   Right Knee Flexion 140  was120     Strength   Right Hip Flexion 5/5   Right Hip Extension 5/5   Right Hip ABduction 5/5   Right Knee Flexion 5/5   Right Knee Extension 5/5   Right Ankle Dorsiflexion 5/5  was 4/5   Right Ankle Plantar Flexion 5/5  was 4/5      Flexibility   Soft Tissue Assessment /Muscle Length yes   Hamstrings Rt 150 was  145; LT 160                     [facility] Adult PT Treatment/Exercise - 06/07/16 0001      Knee/Hip Exercises: Stretches   Active Hamstring Stretch Both;3 reps;30 seconds     Knee/Hip Exercises: Aerobic   Nustep level 3; hills x 12:00      Knee/Hip Exercises: Standing   SLS Rt 60", Lt 50" max of 3     Knee/Hip Exercises: Seated   Sit to Sand 5 reps                  PT Short Term Goals - 06/07/16 9794      PT SHORT TERM GOAL #1   Title PT pain level iin her right knee  to be no greater than a 3/10 to allow pt to feel comfortable being up for an hour.    Time 1   Period Weeks   Status Achieved     PT SHORT TERM GOAL #2   Title Pt to be able to come sit to stand from the couch without increase pain in her right knee   Time 1   Period Weeks   Status Achieved     PT SHORT TERM GOAL #3   Title Pt to be able to ascend and descend steps without increased right knee pain     Time 10   Period Days   Status Achieved           PT Long Term Goals - 06/07/16 8016      PT LONG TERM GOAL #1   Title Pt to be able to go to the tennis courts and volley balls for 45 minutes without having increased right knee pain.    Time 2   Period Weeks   Status Unable to assess     PT LONG TERM GOAL #2   Title Pt to be able to be up on her feet standing/walking for two hours without experiencing increased right knee pain.    Time 2   Period Weeks   Status Achieved               Plan - 06/07/16 0855    Clinical Impression Statement Pt reassessed.  Pt is doing all of her normal activities at this point except for playing tennis.  She has not played tennis at this point as she wants to be cleared by her MD>  Pt is I in all exercises and is no longer in need of skilled PT    Rehab Potential Good   PT Frequency 2x / week   PT Duration 2 weeks   PT Treatment/Interventions ADLs/Self Care Home Management;Therapeutic activities;Functional mobility training;Stair training;Gait training;Therapeutic exercise;Balance training;Patient/family education;Passive range of motion;Manual techniques   PT Next Visit Plan Discharge patient to HEP    PT Home Exercise Plan heel raises, functional squat; supine hamstring stretch; sit to stand, step up and SLS.; lateral step up and reciprocal stair training       Patient will benefit from skilled therapeutic intervention in order to improve the following deficits and impairments:  Decreased activity tolerance, Decreased balance, Decreased strength, Decreased range of motion, Difficulty walking, Impaired flexibility, Pain  Visit Diagnosis: Stiffness of right knee, not elsewhere classified  Acute pain of right knee  G-Codes - 06/07/16 0858    Functional Assessment Tool Used (Outpatient Only) foto    Functional Limitation Mobility: Walking and moving around   Mobility: Walking and Moving Around Goal Status [phone]) At least 20  percent but less than 40 percent impaired, limited or restricted   Mobility: Walking and Moving Around Discharge Status [phone]) At least 20 percent but less than 40 percent impaired, limited or restricted      Problem List There are no active problems to display for this patient. Sarah Nelson, PT CLT [phone] 06/07/2016, 9:04 AM  Sarah Nelson, [place], [address] Phone: [phone]   Fax:  [phone]  Name: Sarah Nelson MRN: [id number] Date of Birth: November 12, 1935  PHYSICAL THERAPY DISCHARGE SUMMARY  Visits from Start of Care: 4  Current functional level related to goals / functional outcomes: See above   Remaining deficits: See above   Education / Equipment: HEP Plan: Patient agrees to discharge.  Patient goals were met. Patient is being discharged due to meeting the stated rehab goals.  ?????        Sarah Nelson, [place] CLT [phone]

## 2017-04-23 ENCOUNTER — Other Ambulatory Visit (HOSPITAL_COMMUNITY): Payer: Self-pay | Admitting: Pulmonary Disease

## 2017-04-23 DIAGNOSIS — I10 Essential (primary) hypertension: Secondary | ICD-10-CM

## 2017-04-30 ENCOUNTER — Ambulatory Visit (HOSPITAL_COMMUNITY)
Admission: RE | Admit: 2017-04-30 | Discharge: 2017-04-30 | Disposition: A | Discharge status: Home / Self Care | Payer: Medicare HMO | Source: Ambulatory Visit | Attending: Pulmonary Disease | Admitting: Pulmonary Disease

## 2017-04-30 DIAGNOSIS — K760 Fatty (change of) liver, not elsewhere classified: Secondary | ICD-10-CM | POA: Diagnosis not present

## 2017-04-30 DIAGNOSIS — I10 Essential (primary) hypertension: Secondary | ICD-10-CM | POA: Insufficient documentation

## 2017-04-30 DIAGNOSIS — K7689 Other specified diseases of liver: Secondary | ICD-10-CM | POA: Diagnosis not present

## 2017-04-30 DIAGNOSIS — I358 Other nonrheumatic aortic valve disorders: Secondary | ICD-10-CM | POA: Insufficient documentation

## 2017-04-30 DIAGNOSIS — I119 Hypertensive heart disease without heart failure: Secondary | ICD-10-CM | POA: Diagnosis not present

## 2017-04-30 NOTE — Progress Notes (Signed)
*  PRELIMINARY RESULTS* Echocardiogram 2D Echocardiogram has been performed.  Stacey DrainWhite, Leveda Kendrix J 04/30/2017, 12:11 PM

## 2017-05-07 DIAGNOSIS — I639 Cerebral infarction, unspecified: Secondary | ICD-10-CM

## 2017-05-07 HISTORY — DX: Cerebral infarction, unspecified: I63.9

## 2017-05-08 ENCOUNTER — Other Ambulatory Visit (HOSPITAL_COMMUNITY): Payer: Self-pay | Admitting: Pulmonary Disease

## 2017-05-08 DIAGNOSIS — R531 Weakness: Secondary | ICD-10-CM

## 2017-05-09 ENCOUNTER — Other Ambulatory Visit: Payer: Self-pay

## 2017-05-09 ENCOUNTER — Encounter (HOSPITAL_COMMUNITY): Payer: Self-pay | Admitting: Emergency Medicine

## 2017-05-09 ENCOUNTER — Ambulatory Visit (HOSPITAL_COMMUNITY)
Admission: RE | Admit: 2017-05-09 | Discharge: 2017-05-09 | Disposition: A | Discharge status: Home / Self Care | Payer: Medicare HMO | Source: Ambulatory Visit | Attending: Pulmonary Disease | Admitting: Pulmonary Disease

## 2017-05-09 ENCOUNTER — Observation Stay (HOSPITAL_COMMUNITY)
Admission: EM | Admit: 2017-05-09 | Discharge: 2017-05-10 | Disposition: A | Discharge status: Home / Self Care | Payer: Medicare HMO | Attending: Pulmonary Disease | Admitting: Pulmonary Disease

## 2017-05-09 ENCOUNTER — Observation Stay (HOSPITAL_COMMUNITY): Payer: Medicare HMO

## 2017-05-09 DIAGNOSIS — R260 Ataxic gait: Secondary | ICD-10-CM | POA: Diagnosis present

## 2017-05-09 DIAGNOSIS — Z7982 Long term (current) use of aspirin: Secondary | ICD-10-CM | POA: Insufficient documentation

## 2017-05-09 DIAGNOSIS — R531 Weakness: Secondary | ICD-10-CM

## 2017-05-09 DIAGNOSIS — E039 Hypothyroidism, unspecified: Secondary | ICD-10-CM | POA: Diagnosis not present

## 2017-05-09 DIAGNOSIS — Z79899 Other long term (current) drug therapy: Secondary | ICD-10-CM | POA: Insufficient documentation

## 2017-05-09 DIAGNOSIS — I10 Essential (primary) hypertension: Secondary | ICD-10-CM | POA: Insufficient documentation

## 2017-05-09 DIAGNOSIS — I639 Cerebral infarction, unspecified: Principal | ICD-10-CM | POA: Insufficient documentation

## 2017-05-09 HISTORY — DX: Disorder of thyroid, unspecified: E07.9

## 2017-05-09 HISTORY — DX: Pure hypercholesterolemia, unspecified: E78.00

## 2017-05-09 HISTORY — DX: Essential (primary) hypertension: I10

## 2017-05-09 LAB — BASIC METABOLIC PANEL
ANION GAP: 11 (ref 5–15)
BUN: 14 mg/dL (ref 6–20)
CALCIUM: 9.1 mg/dL (ref 8.9–10.3)
CO2: 25 mmol/L (ref 22–32)
CREATININE: 1 mg/dL (ref 0.44–1.00)
Chloride: 106 mmol/L (ref 101–111)
GFR, EST NON AFRICAN AMERICAN: 52 mL/min — AB (ref >60)
Glucose, Bld: 125 mg/dL — ABNORMAL HIGH (ref 65–99)
Potassium: 4 mmol/L (ref 3.5–5.1)
Sodium: 142 mmol/L (ref 135–145)

## 2017-05-09 LAB — CBC WITH DIFFERENTIAL/PLATELET
BASOS ABS: 0 10*3/uL (ref 0.0–0.1)
Basophils Relative: 0 %
EOS ABS: 0.1 10*3/uL (ref 0.0–0.7)
EOS PCT: 1 %
HCT: 41.7 % (ref 36.0–46.0)
Hemoglobin: 13.5 g/dL (ref 12.0–15.0)
Lymphocytes Relative: 21 %
Lymphs Abs: 1.4 10*3/uL (ref 0.7–4.0)
MCH: 29.3 pg (ref 26.0–34.0)
MCHC: 32.4 g/dL (ref 30.0–36.0)
MCV: 90.7 fL (ref 78.0–100.0)
MONO ABS: 0.3 10*3/uL (ref 0.1–1.0)
MONOS PCT: 5 %
NEUTROS ABS: 5 10*3/uL (ref 1.7–7.7)
Neutrophils Relative %: 73 %
PLATELETS: 239 10*3/uL (ref 150–400)
RBC: 4.6 MIL/uL (ref 3.87–5.11)
RDW: 12.4 % (ref 11.5–15.5)
WBC: 6.8 10*3/uL (ref 4.0–10.5)

## 2017-05-09 MED ORDER — ACETAMINOPHEN 650 MG RE SUPP: 650.0000 mg | RECTAL | Status: DC | PRN

## 2017-05-09 MED ORDER — SENNOSIDES-DOCUSATE SODIUM 8.6-50 MG PO TABS: 1.0000 | ORAL_TABLET | Freq: Every evening | ORAL | Status: DC | PRN

## 2017-05-09 MED ORDER — LEVOTHYROXINE SODIUM 137 MCG PO TABS
137.0000 ug | ORAL_TABLET | Freq: Every day | ORAL | Status: DC
  Administered 2017-05-10: 137 ug via ORAL
  Filled 2017-05-09: qty 1

## 2017-05-09 MED ORDER — STROKE: EARLY STAGES OF RECOVERY BOOK
Freq: Once | Status: AC
  Administered 2017-05-09: 19:00:00
  Filled 2017-05-09: qty 1

## 2017-05-09 MED ORDER — ACETAMINOPHEN 160 MG/5ML PO SOLN: 650.0000 mg | ORAL | Status: DC | PRN

## 2017-05-09 MED ORDER — SODIUM CHLORIDE 0.9 % IV SOLN
INTRAVENOUS | Status: DC
  Administered 2017-05-09: 19:00:00 via INTRAVENOUS

## 2017-05-09 MED ORDER — HYDRALAZINE HCL 20 MG/ML IJ SOLN: 10.0000 mg | Freq: Four times a day (QID) | INTRAMUSCULAR | Status: DC | PRN

## 2017-05-09 MED ORDER — ACETAMINOPHEN 325 MG PO TABS
650.0000 mg | ORAL_TABLET | ORAL | Status: DC | PRN
  Administered 2017-05-09: 650 mg via ORAL
  Filled 2017-05-09 (×3): qty 2

## 2017-05-09 MED ORDER — ASPIRIN 325 MG PO TABS
325.0000 mg | ORAL_TABLET | Freq: Once | ORAL | Status: AC
  Administered 2017-05-09: 325 mg via ORAL
  Filled 2017-05-09: qty 1

## 2017-05-09 MED ORDER — ROSUVASTATIN CALCIUM 10 MG PO TABS
5.0000 mg | ORAL_TABLET | Freq: Every day | ORAL | Status: DC
  Administered 2017-05-09: 5 mg via ORAL
  Filled 2017-05-09: qty 1

## 2017-05-09 MED ORDER — ASPIRIN 325 MG PO TABS
325.0000 mg | ORAL_TABLET | Freq: Every day | ORAL | Status: DC
  Administered 2017-05-10: 325 mg via ORAL
  Filled 2017-05-09: qty 1

## 2017-05-09 MED ORDER — ENOXAPARIN SODIUM 40 MG/0.4ML ~~LOC~~ SOLN
40.0000 mg | SUBCUTANEOUS | Status: DC
  Administered 2017-05-09: 40 mg via SUBCUTANEOUS
  Filled 2017-05-09: qty 0.4

## 2017-05-09 NOTE — ED Provider Notes (Signed)
[facility] EMERGENCY DEPARTMENT Provider Note   CSN: [id number] Arrival date & time: 05/09/17  4098     History   Chief Complaint Chief Complaint  Patient presents with  . Cerebrovascular Accident    HPI Sarah Nelson is a 82 y.o. female.  Level 5 caveat for urgent need for intervention.  Patient reports sensation of unsteady gait and ataxia for 2 to 3 weeks.  She was seen by her primary care doctor Shaune Pollack) and her blood pressure was elevated.  Lopressor was started.  She continued to have an ataxic gait.  Review of systems positive for questionable right foot weakness.  Her primary care doctor ordered an MRI of her brain today.  MRI suggested an acute deep white matter left MCA lenticulostriate oratory infarct.  No gross extremity weakness, facial asymmetry, confusion, stiff neck.     Past Medical History:  Diagnosis Date  . High cholesterol   . Hypertension   . Thyroid disease     There are no active problems to display for this patient.   Past Surgical History:  Procedure Laterality Date  . APPENDECTOMY    . MENISCUS REPAIR Right   . ROTATOR CUFF REPAIR Right      OB History    Gravida  3   Para  3   Term  3   Preterm      AB      Living  2     SAB      TAB      Ectopic      Multiple      Live Births               Home Medications    Prior to Admission medications   Medication Sig Start Date End Date Taking? Authorizing Provider  aspirin EC 81 MG tablet Take 1 tablet by mouth daily.   Yes [provider]  levothyroxine (SYNTHROID, LEVOTHROID) 137 MCG tablet Take 137 mcg by mouth daily. 04/29/17  Yes [provider]  metoprolol tartrate (LOPRESSOR) 100 MG tablet Take 50 mg by mouth 2 (two) times daily.  04/21/17  Yes [provider]  naproxen sodium (ALEVE) 220 MG tablet Take 1 tablet by mouth daily as needed.   Yes [provider]  rosuvastatin (CRESTOR) 5 MG tablet Take 1 tablet by mouth  daily.   Yes [provider]    Family History Family History  Problem Relation Age of Onset  . Stroke Mother   . Hypertension Mother   . Cancer Father   . Cancer Brother     Social History Social History   Tobacco Use  . Smoking status: Never Smoker  . Smokeless tobacco: Never Used  Substance Use Topics  . Alcohol use: Never    Frequency: Never  . Drug use: Never     Allergies   Patient has no known allergies.   Review of Systems Review of Systems  Unable to perform ROS: Acuity of condition     Physical Exam Updated Vital Signs BP (!) 123/53   Pulse (!) 59   Temp 97.8 F (36.6 C)   Resp 19   Ht  (1.676 m)   Wt 68 kg (150 lb)   SpO2 95%   BMI 24.21 kg/m   Physical Exam  Constitutional: She is oriented to person, place, and time. She appears well-developed and well-nourished.  HENT:  Head: Normocephalic and atraumatic.  Eyes: Conjunctivae are normal.  Neck: Neck  supple.  Cardiovascular: Normal rate and regular rhythm.  Pulmonary/Chest: Effort normal and breath sounds normal.  Abdominal: Soft. Bowel sounds are normal.  Musculoskeletal: Normal range of motion.  Neurological: She is alert and oriented to person, place, and time.  Skin: Skin is warm and dry.  Psychiatric: She has a normal mood and affect. Her behavior is normal.  Nursing note and vitals reviewed.    ED Treatments / Results  Labs (all labs ordered are listed, but only abnormal results are displayed) Labs Reviewed  BASIC METABOLIC PANEL - Abnormal; Notable for the following components:      Result Value   Glucose, Bld 125 (*)    GFR calc non Af Amer 52 (*)    All other components within normal limits  CBC WITH DIFFERENTIAL/PLATELET    EKG EKG Interpretation  Date/Time:  Friday May 09 2017 08:53:50 EDT Ventricular Rate:  61 PR Interval:    QRS Duration: 94 QT Interval:  437 QTC Calculation: 441 R Axis:   80 Text Interpretation:  Sinus rhythm Borderline T  wave abnormalities Baseline wander in lead(s) I III aVL V1 Confirmed by Donnetta Hutching ([id number]) on 05/09/2017 10:14:33 AM   Radiology Mr Brain Wo Contrast  Result Date: 05/09/2017 CLINICAL DATA:  Imbalance, unsteady gait.  Headache. EXAM: MRI HEAD WITHOUT CONTRAST TECHNIQUE: Multiplanar, multiecho pulse sequences of the brain and surrounding structures were obtained without intravenous contrast. COMPARISON:  11/23/2012. FINDINGS: Brain: Restricted diffusion affects the LEFT periventricular white matter, posterior lentiform nucleus, consistent with acute infarction. This is most consistent with a LEFT MCA territory infarct. No hemorrhage, mass lesion, hydrocephalus, or extra-axial fluid. Mild atrophy. Mild to moderate subcortical and periventricular T2 and FLAIR hyperintensities, likely chronic microvascular ischemic change. Vascular: Flow voids are maintained throughout the carotid, basilar, and vertebral arteries. There are no areas of chronic hemorrhage. Skull and upper cervical spine: Unremarkable visualized calvarium, skullbase, and cervical vertebrae. Pituitary, pineal, cerebellar tonsils unremarkable. No upper cervical cord lesions. Sinuses/Orbits: No orbital masses or proptosis. Globes appear symmetric. Sinuses appear well aerated, without evidence for air-fluid level. Other: No nasopharyngeal pathology or significant mastoid fluid. Scalp and other visualized extracranial soft tissues grossly unremarkable. Compared with prior MR, this infarct was not present. 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 No visible hemorrhage. Atrophy with mild to moderate small vessel disease. These results will be called to the ordering clinician or representative by the Radiologist Assistant, and communication documented in the PACS or zVision Dashboard. Electronically Signed   By: Elsie Stain M.D.   On: 05/09/2017 07:56    Procedures Procedures (including critical care  time)  Medications Ordered in ED Medications  aspirin tablet 325 mg (325 mg Oral Given 05/09/17 1017)     Initial Impression / Assessment and Plan / ED Course  I have reviewed the triage vital signs and the nursing notes.  Pertinent labs & imaging results that were available during my care of the patient were reviewed by me and considered in my medical decision making (see chart for details).     Patient reports an ataxic gait for 2 weeks with questionable right foot weakness.  MRI today reveals a deep right MCA infarct.  Tele-neurologist stated no acute intervention is necessary.  Admit to general medicine.  Final Clinical Impressions(s) / ED Diagnoses   Final diagnoses:  Cerebrovascular accident (CVA), unspecified mechanism [facility])    ED Discharge Orders    None       Donnetta Hutching, MD 05/09/17  1239  

## 2017-05-09 NOTE — ED Triage Notes (Addendum)
Patient started to have unsteady gait, "feeling unbalanced" x3 weeks ago. Patient noticed blood pressure was high, states "I have never had issues with my blood pressure or had to take anything for it." Per patient seen PCP 2 weeks ago and got put on generic medication for Lopressor, balance got worse. Patient had MRI this morning which was positive for stroke per Dr Juanetta Gosling. Patient states weakness in right leg. Denies any slurred speech, facial drooping, or slurred speech. Per patient mild headache x3 days.

## 2017-05-09 NOTE — H&P (Signed)
History and Physical    Sarah Nelson ZOX:[id number] DOB: [birth year] DOA: 05/09/2017  PCP: Kari Baars, MD  Patient coming from: Home  I have personally briefly reviewed patient's old medical records in [facility]  Chief Complaint: Weakness and leg  HPI: Sarah Nelson is a 82 y.o. female with medical history significant of hypertension and hypothyroidism, has been experiencing difficulty with ambulation, unsteady on feet for the past 2 to 3 weeks.  She feels that her right leg may be weaker than her left.  She is noticed when she is been walking on the treadmill that her right foot is dragging at times.  Denies any numbness or tingling.  No complaints in upper extremities.  No changes in vision, speech, swallowing.  She denies dizziness.  She has not had any fever, cough, chest pain, shortness of breath, vomiting, diarrhea.  She had seen her primary care physician and noted to have elevated blood pressure.  She was recently started on metoprolol.  When her symptoms persisted, she underwent MRI brain that confirmed acute CVA.  She was sent to the ER for evaluation.  ED Course: Patient was evaluated in the emergency room where she was noted to be mildly hypertensive.  Basic labs were unrevealing.  MRI brain confirms stroke.  She is being admitted for further evaluation.  Review of Systems: As per HPI otherwise 10 point review of systems negative.    Past Medical History:  Diagnosis Date  . High cholesterol   . Hypertension   . Thyroid disease     Past Surgical History:  Procedure Laterality Date  . APPENDECTOMY    . MENISCUS REPAIR Right   . ROTATOR CUFF REPAIR Right      reports that she has never smoked. She has never used smokeless tobacco. She reports that she does not drink alcohol or use drugs.  No Known Allergies  Family History  Problem Relation Age of Onset  . Stroke Mother   . Hypertension Mother   . Cancer Father   . Cancer Brother     Prior  to Admission medications   Medication Sig Start Date End Date Taking? Authorizing Provider  aspirin EC 81 MG tablet Take 1 tablet by mouth daily.   Yes [provider]  levothyroxine (SYNTHROID, LEVOTHROID) 137 MCG tablet Take 137 mcg by mouth daily. 04/29/17  Yes [provider]  metoprolol tartrate (LOPRESSOR) 100 MG tablet Take 50 mg by mouth 2 (two) times daily.  04/21/17  Yes [provider]  naproxen sodium (ALEVE) 220 MG tablet Take 1 tablet by mouth daily as needed.   Yes [provider]  rosuvastatin (CRESTOR) 5 MG tablet Take 1 tablet by mouth daily.   Yes [provider]    Physical Exam: Vitals:   05/09/17 1100 05/09/17 1130 05/09/17 1400 05/09/17 1533  BP: (!) 135/57 (!) 123/53 (!) 145/60 (!) 169/55  Pulse: (!) 55 (!) 59 (!) 52 (!) 54  Resp: Temp:    98.6 F (37 C)  TempSrc:    Oral  SpO2: 98% 95% 97% 100%  Weight:    69.8 kg (153 lb 14.1 oz)  Height:     (1.676 m)    Constitutional: NAD, calm, comfortable Vitals:   05/09/17 1100 05/09/17 1130 05/09/17 1400 05/09/17 1533  BP: (!) 135/57 (!) 123/53 (!) 145/60 (!) 169/55  Pulse: (!) 55 (!) 59 (!) 52 (!) 54  Resp: 16  Temp:    98.6 F (37 C)  TempSrc:    Oral  SpO2: 98% 95% 97% 100%  Weight:    69.8 kg (153 lb 14.1 oz)  Height:     (1.676 m)   Eyes: PERRL, lids and conjunctivae normal ENMT: Mucous membranes are moist. Posterior pharynx clear of any exudate or lesions.Normal dentition.  Neck: normal, supple, no masses, no thyromegaly Respiratory: clear to auscultation bilaterally, no wheezing, no crackles. Normal respiratory effort. No accessory muscle use.  Cardiovascular: Regular rate and rhythm, no murmurs / rubs / gallops. No extremity edema. 2+ pedal pulses. No carotid bruits.  Abdomen: no tenderness, no masses palpated. No hepatosplenomegaly. Bowel sounds positive.  Musculoskeletal: no clubbing / cyanosis. No joint deformity upper and  lower extremities. Good ROM, no contractures. Normal muscle tone.  Skin: no rashes, lesions, ulcers. No induration Neurologic: CN 2-12 grossly intact. Sensation intact, DTR normal. Strength 5/5 in all 4.  Psychiatric: Normal judgment and insight. Alert and oriented x 3. Normal mood.    Labs on Admission: I have personally reviewed following labs and imaging studies  CBC: Recent Labs  Lab 05/09/17 1006  WBC 6.8  NEUTROABS 5.0  HGB 13.5  HCT 41.7  MCV 90.7  PLT 239   Basic Metabolic Panel: Recent Labs  Lab 05/09/17 1006  NA 142  K 4.0  CL 106  CO2 25  GLUCOSE 125*  BUN 14  CREATININE 1.00  CALCIUM 9.1   GFR: Estimated Creatinine Clearance: 42.7 mL/min (by C-G formula based on SCr of 1 mg/dL). Liver Function Tests: No results for input(s): AST, ALT, ALKPHOS, BILITOT, PROT, ALBUMIN in the last 168 hours. No results for input(s): LIPASE, AMYLASE in the last 168 hours. No results for input(s): AMMONIA in the last 168 hours. Coagulation Profile: No results for input(s): INR, PROTIME in the last 168 hours. Cardiac Enzymes: No results for input(s): CKTOTAL, CKMB, CKMBINDEX, TROPONINI in the last 168 hours. BNP (last 3 results) No results for input(s): PROBNP in the last 8760 hours. HbA1C: No results for input(s): HGBA1C in the last 72 hours. CBG: No results for input(s): GLUCAP in the last 168 hours. Lipid Profile: No results for input(s): CHOL, HDL, LDLCALC, TRIG, CHOLHDL, LDLDIRECT in the last 72 hours. Thyroid Function Tests: No results for input(s): TSH, T4TOTAL, FREET4, T3FREE, THYROIDAB in the last 72 hours. Anemia Panel: No results for input(s): VITAMINB12, FOLATE, FERRITIN, TIBC, IRON, RETICCTPCT in the last 72 hours. Urine analysis: No results found for: COLORURINE, APPEARANCEUR, LABSPEC, PHURINE, GLUCOSEU, HGBUR, BILIRUBINUR, KETONESUR, PROTEINUR, UROBILINOGEN, NITRITE, LEUKOCYTESUR  Radiological Exams on Admission: Mr Brain Wo Contrast  Result Date:  05/09/2017 CLINICAL DATA:  Imbalance, unsteady gait.  Headache. EXAM: MRI HEAD WITHOUT CONTRAST TECHNIQUE: Multiplanar, multiecho pulse sequences of the brain and surrounding structures were obtained without intravenous contrast. COMPARISON:  11/23/2012. FINDINGS: Brain: Restricted diffusion affects the LEFT periventricular white matter, posterior lentiform nucleus, consistent with acute infarction. This is most consistent with a LEFT MCA territory infarct. No hemorrhage, mass lesion, hydrocephalus, or extra-axial fluid. Mild atrophy. Mild to moderate subcortical and periventricular T2 and FLAIR hyperintensities, likely chronic microvascular ischemic change. Vascular: Flow voids are maintained throughout the carotid, basilar, and vertebral arteries. There are no areas of chronic hemorrhage. Skull and upper cervical spine: Unremarkable visualized calvarium, skullbase, and cervical vertebrae. Pituitary, pineal, cerebellar tonsils unremarkable. No upper cervical cord lesions. Sinuses/Orbits: No orbital masses or proptosis. Globes appear symmetric. Sinuses appear well aerated, without evidence for air-fluid level. Other: No nasopharyngeal  pathology or significant mastoid fluid. Scalp and other visualized extracranial soft tissues grossly unremarkable. Compared with prior MR, this infarct was not present. 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 No visible hemorrhage. Atrophy with mild to moderate small vessel disease. These results will be called to the ordering clinician or representative by the Radiologist Assistant, and communication documented in the PACS or zVision Dashboard. Electronically Signed   By: Elsie Stain M.D.   On: 05/09/2017 07:56    EKG: Independently reviewed.  Sinus rhythm without any acute changes  Assessment/Plan Principal Problem:   CVA (cerebral vascular accident) (HCC) Active Problems:   HTN (hypertension)   Hypothyroidism     1. Acute CVA.   MRI brain confirms acute deep white matter left MCA lenticulostriate territory infarct.  The patient is already on aspirin 81 mg daily.  Will change to 325 mg daily.  Check MRA head, echocardiogram and carotid Dopplers.  Check lipid panel and A1c.  Neurology consult. 2. Hypertension.  Will hold metoprolol at this time for permissive hypertension.  Use hydralazine as needed 3. Hypothyroidism.  Continue on Synthroid  DVT prophylaxis: Lovenox Code Status: Full code Family Communication: Discussed with husband at the bedside Disposition Plan: Discharge home once improved Consults called: Neurology Admission status: Observation, telemetry  Erick Blinks MD [facility] Pager [phone]  If 7PM-7AM, please contact night-coverage [email or web address] Password [email or web address]  05/09/2017, 4:50 PM

## 2017-05-10 ENCOUNTER — Observation Stay (HOSPITAL_COMMUNITY): Payer: Medicare HMO

## 2017-05-10 ENCOUNTER — Observation Stay (HOSPITAL_BASED_OUTPATIENT_CLINIC_OR_DEPARTMENT_OTHER): Payer: Medicare HMO

## 2017-05-10 DIAGNOSIS — I5030 Unspecified diastolic (congestive) heart failure: Secondary | ICD-10-CM

## 2017-05-10 LAB — ECHOCARDIOGRAM COMPLETE
HEIGHTINCHES: 66 in
WEIGHTICAEL: 2462.1 [oz_av]

## 2017-05-10 LAB — LIPID PANEL
Cholesterol: 139 mg/dL (ref 0–200)
HDL: 46 mg/dL (ref >40)
LDL CALC: 72 mg/dL (ref 0–99)
TRIGLYCERIDES: 105 mg/dL (ref <150)
Total CHOL/HDL Ratio: 3 RATIO
VLDL: 21 mg/dL (ref 0–40)

## 2017-05-10 LAB — HEMOGLOBIN A1C
Hgb A1c MFr Bld: 5.6 % (ref 4.8–5.6)
Mean Plasma Glucose: 114.02 mg/dL

## 2017-05-10 MED ORDER — ASPIRIN 325 MG PO TABS: 325.0000 mg | ORAL_TABLET | Freq: Every day | ORAL | 12 refills | Status: DC

## 2017-05-10 MED ORDER — METOPROLOL TARTRATE 100 MG PO TABS: ORAL_TABLET | ORAL | 12 refills | Status: DC

## 2017-05-10 NOTE — Progress Notes (Signed)
Pt discharged home today per Dr. Hawkins. Pt's IV site D/C'd and WDL. Pt's VSS. Pt provided with home medication list, discharge instructions and prescriptions. Verbalized understanding. Pt left floor via WC in stable condition accompanied by RN. 

## 2017-05-10 NOTE — Progress Notes (Signed)
  Echocardiogram 2D Echocardiogram has been performed.  Sarah Nelson 05/10/2017, 9:14 AM

## 2017-05-10 NOTE — Progress Notes (Signed)
Subjective: She says she feels okay.  She may still have a little bit of unbalance.  Correction to her history is that she came to my office about 2 weeks ago with high blood pressure but denied having any neurological symptoms at that time.  She says in retrospect she may have been having a little bit of the unbalance then but really was not aware of it.  She says today she feels okay may be with a little bit of the unbalance but she is interested in going home Objective: Vital signs in last 24 hours: Temp:  [97.8 F (36.6 C)-98.6 F (37 C)] 97.8 F (36.6 C) (05/04 0719) Pulse Rate:  [52-63] 53 (05/04 0719) Resp:  [12-19] 18 (05/04 0719) BP: (123-180)/(53-79) 148/63 (05/04 0719) SpO2:  [95 %-100 %] 100 % (05/04 0719) Weight:  [69.8 kg (153 lb 14.1 oz)] 69.8 kg (153 lb 14.1 oz) (05/03 1533) Weight change:  Last BM Date: 05/08/17  Intake/Output from previous day: 05/03 0701 - 05/04 0700 In: 801.7 [P.O.:240; I.V.:561.7] Out: 100 [Urine:100]  PHYSICAL EXAM General appearance: alert, cooperative and no distress Resp: clear to auscultation bilaterally Cardio: regular rate and rhythm, S1, S2 normal, no murmur, click, rub or gallop GI: soft, non-tender; bowel sounds normal; no masses,  no organomegaly Extremities: extremities normal, atraumatic, no cyanosis or edema Skin warm and dry muscle strength equal bilaterally  Lab Results:  Results for orders placed or performed during the hospital encounter of 05/09/17 (from the past 48 hour(s))  CBC with Differential     Status: None   Collection Time: 05/09/17 10:06 AM  Result Value Ref Range   WBC 6.8 4.0 - 10.5 K/uL   RBC 4.60 3.87 - 5.11 MIL/uL   Hemoglobin 13.5 12.0 - 15.0 g/dL   HCT 41.7 36.0 - 46.0 %   MCV 90.7 78.0 - 100.0 fL   MCH 29.3 26.0 - 34.0 pg   MCHC 32.4 30.0 - 36.0 g/dL   RDW 12.4 11.5 - 15.5 %   Platelets 239 150 - 400 K/uL   Neutrophils Relative % 73 %   Neutro Abs 5.0 1.7 - 7.7 K/uL   Lymphocytes Relative 21 %    Lymphs Abs 1.4 0.7 - 4.0 K/uL   Monocytes Relative 5 %   Monocytes Absolute 0.3 0.1 - 1.0 K/uL   Eosinophils Relative 1 %   Eosinophils Absolute 0.1 0.0 - 0.7 K/uL   Basophils Relative 0 %   Basophils Absolute 0.0 0.0 - 0.1 K/uL    Comment: Performed at [facility], [address], [place], [place] [address]  Basic metabolic panel     Status: Abnormal   Collection Time: 05/09/17 10:06 AM  Result Value Ref Range   Sodium 142 135 - 145 mmol/L   Potassium 4.0 3.5 - 5.1 mmol/L   Chloride 106 101 - 111 mmol/L   CO2 25 22 - 32 mmol/L   Glucose, Bld 125 (H) 65 - 99 mg/dL   BUN 14 6 - 20 mg/dL   Creatinine, Ser 1.00 0.44 - 1.00 mg/dL   Calcium 9.1 8.9 - 10.3 mg/dL   GFR calc non Af Amer 52 (L) >60 mL/min   GFR calc Af Amer >60 >60 mL/min    Comment: (NOTE) The eGFR has been calculated using the CKD EPI equation. This calculation has not been validated in all clinical situations. eGFR's persistently <60 mL/min signify possible Chronic Kidney Disease.    Anion gap 11 5 - 15    Comment:  Performed at [facility], [address], [place], [place] [address]  Lipid panel     Status: None   Collection Time: 05/10/17  6:35 AM  Result Value Ref Range   Cholesterol 139 0 - 200 mg/dL   Triglycerides 105 <150 mg/dL   HDL 46 >40 mg/dL   Total CHOL/HDL Ratio 3.0 RATIO   VLDL 21 0 - 40 mg/dL   LDL Cholesterol 72 0 - 99 mg/dL    Comment:        Total Cholesterol/HDL:CHD Risk Coronary Heart Disease Risk Table                     Men   Women  1/2 Average Risk   3.4   3.3  Average Risk       5.0   4.4  2 X Average Risk   9.6   7.1  3 X Average Risk  23.4   11.0        Use the calculated Patient Ratio above and the CHD Risk Table to determine the patient's CHD Risk.        ATP III CLASSIFICATION (LDL):  <100     mg/dL   Optimal  100-129  mg/dL   Near or Above                    Optimal  130-159  mg/dL   Borderline  160-189  mg/dL   High  >190     mg/dL   Very High Performed at [place] [place], [place] [address]     ABGS No results for input(s): PHART, PO2ART, TCO2, HCO3 in the last 72 hours.  Invalid input(s): PCO2 CULTURES No results found for this or any previous visit (from the past 240 hour(s)). Studies/Results: Mr Brain Wo Contrast  Result Date: 05/09/2017 CLINICAL DATA:  Imbalance, unsteady gait.  Headache. EXAM: MRI HEAD WITHOUT CONTRAST TECHNIQUE: Multiplanar, multiecho pulse sequences of the brain and surrounding structures were obtained without intravenous contrast. COMPARISON:  11/23/2012. FINDINGS: Brain: Restricted diffusion affects the LEFT periventricular white matter, posterior lentiform nucleus, consistent with acute infarction. This is most consistent with a LEFT MCA territory infarct. No hemorrhage, mass lesion, hydrocephalus, or extra-axial fluid. Mild atrophy. Mild to moderate subcortical and periventricular T2 and FLAIR hyperintensities, likely chronic microvascular ischemic change. Vascular: Flow voids are maintained throughout the carotid, basilar, and vertebral arteries. There are no areas of chronic hemorrhage. Skull and upper cervical spine: Unremarkable visualized calvarium, skullbase, and cervical vertebrae. Pituitary, pineal, cerebellar tonsils unremarkable. No upper cervical cord lesions. Sinuses/Orbits: No orbital masses or proptosis. Globes appear symmetric. Sinuses appear well aerated, without evidence for air-fluid level. Other: No nasopharyngeal pathology or significant mastoid fluid. Scalp and other visualized extracranial soft tissues grossly unremarkable. Compared with prior MR, this infarct was not present. 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 No visible hemorrhage. Atrophy with mild to moderate small vessel disease. These results will be called to the ordering clinician or representative by the Radiologist Assistant, and communication documented in the PACS or zVision Dashboard.  Electronically Signed   By: Staci Righter M.D.   On: 05/09/2017 07:56   Mr Jodene Nam Head/brain WS Cm  Result Date: 05/09/2017 CLINICAL DATA:  Follow-up examination for acute stroke. EXAM: MRA HEAD WITHOUT CONTRAST TECHNIQUE: Angiographic images of the Circle of Willis were obtained using MRA technique without intravenous contrast. COMPARISON:  Prior MRI from earlier the same day. FINDINGS:  ANTERIOR CIRCULATION: Distal cervical segments of the internal carotid arteries are patent with antegrade flow. Petrous, cavernous, and supraclinoid segments patent without flow-limiting stenosis. A1 segments, anterior communicating artery common anterior cerebral arteries patent bilaterally. Early branching at the right A2 segment noted. M1 segments widely patent without stenosis. Normal MCA bifurcations. Distal MCA branches well perfused and fairly symmetric. Distal small vessel atheromatous irregularity. POSTERIOR CIRCULATION: Vertebral arteries patent to the vertebrobasilar junction without stenosis. Right vertebral artery dominant. Right PICA patent proximally. Left PICA not visualized. Basilar artery widely patent to its distal aspect. Superior cerebral arteries patent proximally. Both of the posterior cerebral arteries primarily supplied via the basilar. Right PCA patent to its distal aspect. Left P2 segment appears to bifurcate early, with poor visualization of the distal left PCA branches, although faintly visible on time-of-flight sequence. Small left P com noted. No intracranial aneurysm. IMPRESSION: 1. Negative intracranial MRA for large vessel occlusion. No proximal high-grade or correctable stenosis identified. 2. Early bifurcation of the left P\2 segment with poor visualization of the distal left PCA branches. 3. Distal small vessel atheromatous irregularity. Electronically Signed   By: Jeannine Boga M.D.   On: 05/09/2017 17:59    Medications:  Prior to Admission:  Medications Prior to Admission   Medication Sig Dispense Refill Last Dose  . aspirin EC 81 MG tablet Take 1 tablet by mouth daily.   05/08/2017 at 2230  . levothyroxine (SYNTHROID, LEVOTHROID) 137 MCG tablet Take 137 mcg by mouth daily.  3 05/09/2017 at 0515  . metoprolol tartrate (LOPRESSOR) 100 MG tablet Take 50 mg by mouth 2 (two) times daily.   0 05/08/2017 at 1730  . naproxen sodium (ALEVE) 220 MG tablet Take 1 tablet by mouth daily as needed.   Past Month at Unknown time  . rosuvastatin (CRESTOR) 5 MG tablet Take 1 tablet by mouth daily.   05/08/2017 at 2230   Scheduled: . aspirin  325 mg Oral Daily  . enoxaparin (LOVENOX) injection  40 mg Subcutaneous Q24H  . levothyroxine  137 mcg Oral QAC breakfast  . rosuvastatin  5 mg Oral q1800   Continuous: . sodium chloride 50 mL/hr at 05/09/17 1846   CHJ:[id number] **OR** acetaminophen (TYLENOL) oral liquid 160 mg/5 mL **OR** acetaminophen, hydrALAZINE, senna-docusate  Assesment: She had a stroke.  She is better.  She has had fairly recent onset of hypertension.  She had been on metoprolol which has been held. Principal Problem:   CVA (cerebral vascular accident) [facility]) Active Problems:   HTN (hypertension)   Hypothyroidism    Plan: She may be able to go home later today.  She had echocardiogram on the 24th that did not show any substantial abnormalities.  I am awaiting the vascular ultrasound on her neck.  Neurology consultation has been ordered but we do not have neurology available over the weekend    LOS: 0 days   Luretha Eberly L 05/10/2017, 10:05 AM

## 2017-05-10 NOTE — Evaluation (Signed)
Physical Therapy Evaluation Patient Details Name: Sarah Nelson MRN: [id number] DOB: 09-18-1935 Today's Date: 05/10/2017   History of Present Illness   Sarah Nelson is a 82 y.o. female with medical history significant of hypertension and hypothyroidism, has been experiencing difficulty with ambulation, unsteady on feet for the past 2 to 3 weeks.  She feels that her right leg may be weaker than her left.  She is noticed when she is been walking on the treadmill that her right foot is dragging at times.  Denies any numbness or tingling.  No complaints in upper extremities.  No changes in vision, speech, swallowing.  She denies dizziness.  She has not had any fever, cough, chest pain, shortness of breath, vomiting, diarrhea.  She had seen her primary care physician and noted to have elevated blood pressure.  She was recently started on metoprolol.  When her symptoms persisted, she underwent MRI brain that confirmed acute CVA.  She was sent to the ER for evaluation.  Clinical Impression  PT is totally I with bed mobility and ambulation.  Gross mm test shows LE strength normal.  Pt normally plays tennis 2x a week; stating that she does not feel that she could do this right now.  Therapist suggested waiting for a week or two if at that time she has not returned to playing tennis she should seek an outpatient evaluation.    Follow Up Recommendations Outpatient PT    Equipment Recommendations  None recommended by PT    Recommendations for Other Services       Precautions / Restrictions Precautions Precautions: None Restrictions Weight Bearing Restrictions: No      Mobility  Bed Mobility Overal bed mobility: Independent                Transfers Overall transfer level: Independent Equipment used: None                Ambulation/Gait Ambulation/Gait assistance: Independent     Gait Pattern/deviations: WFL(Within Functional Limits) Gait velocity: normal for age          Balance Overall balance assessment: Independent                                           Pertinent Vitals/Pain Pain Assessment: No/denies pain    Home Living Family/patient expects to be discharged to:: Private residence Living Arrangements: Spouse/significant other Available Help at Discharge: Family Type of Home: House Home Access: Stairs to enter       Home Equipment: None      Prior Function Level of Independence: Independent         Comments: PT normally plays tennis 2x a week      Hand Dominance        Extremity/Trunk Assessment        Lower Extremity Assessment Lower Extremity Assessment: Overall WFL for tasks assessed       Communication   Communication: No difficulties  Cognition Arousal/Alertness: Awake/alert   Overall Cognitive Status: Within Functional Limits for tasks assessed                                        General Comments      Exercises     Assessment/Plan    PT Assessment All further PT  needs can be met in the next venue of care  PT Problem List Other (comment)(High balance deficits : does not believe she could play tennis )       PT Treatment Interventions      PT Goals (Current goals can be found in the Care Plan section)  Acute Rehab PT Goals Patient Stated Goal: Discharge from acute PT .  May want an outpatient evaluation to work on high level actiivity to return to tennis  PT Goal Formulation: With patient/family Time For Goal Achievement: 05/16/17 Potential to Achieve Goals: Good                                   End of Session Equipment Utilized During Treatment: Gait belt Activity Tolerance: Patient tolerated treatment well Patient left: in bed;with call bell/phone within reach;with family/visitor present   PT Visit Diagnosis: Other (comment)(High balance deficit :  balance is fine for ambulation )    Time:  855- 915     Charges:  Evaluation               Rayetta Humphrey, PT CLT [phone] 05/10/2017, 9:26 AM

## 2017-05-12 NOTE — Discharge Summary (Signed)
Physician Discharge Summary  Patient ID: Sarah Nelson MRN: [id number] DOB/AGE: 80-28-1937 80 y.o. Primary Care Physician:Abiha Lukehart, Ramon Dredge, MD Admit date: 05/09/2017 Discharge date: 05/12/2017    Discharge Diagnoses:   Principal Problem:   CVA (cerebral vascular accident) [facility]) Active Problems:   HTN (hypertension)   Hypothyroidism   Allergies as of 05/10/2017   No Known Allergies     Medication List    STOP taking these medications   aspirin EC 81 MG tablet Replaced by:  aspirin 325 MG tablet     TAKE these medications   aspirin 325 MG tablet Take 1 tablet (325 mg total) by mouth daily. Replaces:  aspirin EC 81 MG tablet   levothyroxine 137 MCG tablet Commonly known as:  SYNTHROID, LEVOTHROID Take 137 mcg by mouth daily.   metoprolol tartrate 100 MG tablet Commonly known as:  LOPRESSOR Check blood pressure at home twice a day and if your blood pressure is greater than 180 systolic then take 1/2 tablet What changed:    how much to take  how to take this  when to take this  additional instructions   naproxen sodium 220 MG tablet Commonly known as:  ALEVE Take 1 tablet by mouth daily as needed.   rosuvastatin 5 MG tablet Commonly known as:  CRESTOR Take 1 tablet by mouth daily.       Discharged Condition: Improved    Consults: None  Significant Diagnostic Studies: Mr Brain Wo Contrast  Result Date: 05/09/2017 CLINICAL DATA:  Imbalance, unsteady gait.  Headache. EXAM: MRI HEAD WITHOUT CONTRAST TECHNIQUE: Multiplanar, multiecho pulse sequences of the brain and surrounding structures were obtained without intravenous contrast. COMPARISON:  11/23/2012. FINDINGS: Brain: Restricted diffusion affects the LEFT periventricular white matter, posterior lentiform nucleus, consistent with acute infarction. This is most consistent with a LEFT MCA territory infarct. No hemorrhage, mass lesion, hydrocephalus, or extra-axial fluid. Mild atrophy. Mild to moderate  subcortical and periventricular T2 and FLAIR hyperintensities, likely chronic microvascular ischemic change. Vascular: Flow voids are maintained throughout the carotid, basilar, and vertebral arteries. There are no areas of chronic hemorrhage. Skull and upper cervical spine: Unremarkable visualized calvarium, skullbase, and cervical vertebrae. Pituitary, pineal, cerebellar tonsils unremarkable. No upper cervical cord lesions. Sinuses/Orbits: No orbital masses or proptosis. Globes appear symmetric. Sinuses appear well aerated, without evidence for air-fluid level. Other: No nasopharyngeal pathology or significant mastoid fluid. Scalp and other visualized extracranial soft tissues grossly unremarkable. Compared with prior MR, this infarct was not present. 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 No visible hemorrhage. Atrophy with mild to moderate small vessel disease. These results will be called to the ordering clinician or representative by the Radiologist Assistant, and communication documented in the PACS or zVision Dashboard. Electronically Signed   By: Elsie Stain M.D.   On: 05/09/2017 07:56   US Renal  Result Date: 04/30/2017 CLINICAL DATA:  Hypertension EXAM: RENAL / URINARY TRACT ULTRASOUND COMPLETE COMPARISON:  None. FINDINGS: Right Kidney: Length: 9.7 cm. Echogenicity within normal limits. No mass or hydronephrosis visualized. Left Kidney: Length: 10.5 cm. Echogenicity within normal limits. No mass or hydronephrosis visualized. Bladder: Appears normal for degree of bladder distention. Incidental note made of hepatic steatosis. Small cysts in the liver, 1 in the right lobe measuring 1 cm and 1 in the left lobe measuring 1.5 cm. IMPRESSION: 1. Normal ultrasound appearance of the kidneys 2. Hepatic steatosis.  Small liver cysts Electronically Signed   By: Jasmine Pang M.D.   On: 04/30/2017 20:04  US Carotid Bilateral (at [facility] Only)  Result Date:  05/10/2017 CLINICAL DATA:  Acute stroke. EXAM: BILATERAL CAROTID DUPLEX ULTRASOUND TECHNIQUE: Wallace Cullens scale imaging, color Doppler and duplex ultrasound were performed of bilateral carotid and vertebral arteries in the neck. COMPARISON:  None. FINDINGS: Criteria: Quantification of carotid stenosis is based on velocity parameters that correlate the residual internal carotid diameter with NASCET-based stenosis levels, using the diameter of the distal internal carotid lumen as the denominator for stenosis measurement. The following velocity measurements were obtained: RIGHT ICA:  368/70 cm/sec CCA:  65/12 cm/sec SYSTOLIC ICA/CCA RATIO:  5.7 ECA:  154 cm/sec LEFT ICA:  212/33 cm/sec CCA:  123/20 cm/sec SYSTOLIC ICA/CCA RATIO:  1.7 ECA:  95 cm/sec RIGHT CAROTID ARTERY: Significant amount of partially calcified plaque is present at the level of the carotid bulb and extending into the proximal right internal carotid artery. Based on velocities, estimated proximal right ICA stenosis is greater than 70%. RIGHT VERTEBRAL ARTERY: Antegrade flow with normal waveform and velocity. LEFT CAROTID ARTERY: Moderate amount of partially calcified plaque at the level of the carotid bulb and proximal left ICA. Estimated left ICA stenosis is 50-69%. LEFT VERTEBRAL ARTERY: Antegrade flow with normal waveform and velocity. IMPRESSION: Significant atherosclerotic plaque at the level of both carotid bulbs and proximal internal carotid arteries, right greater than left. Estimated proximal right ICA stenosis is greater than 70%. Estimated proximal left ICA stenosis is 50-69%. Electronically Signed   By: Irish Lack M.D.   On: 05/10/2017 11:22   Mr Maxine Glenn Head/brain YQ Cm  Result Date: 05/09/2017 CLINICAL DATA:  Follow-up examination for acute stroke. EXAM: MRA HEAD WITHOUT CONTRAST TECHNIQUE: Angiographic images of the Circle of Willis were obtained using MRA technique without intravenous contrast. COMPARISON:  Prior MRI from earlier the same  day. FINDINGS: ANTERIOR CIRCULATION: Distal cervical segments of the internal carotid arteries are patent with antegrade flow. Petrous, cavernous, and supraclinoid segments patent without flow-limiting stenosis. A1 segments, anterior communicating artery common anterior cerebral arteries patent bilaterally. Early branching at the right A2 segment noted. M1 segments widely patent without stenosis. Normal MCA bifurcations. Distal MCA branches well perfused and fairly symmetric. Distal small vessel atheromatous irregularity. POSTERIOR CIRCULATION: Vertebral arteries patent to the vertebrobasilar junction without stenosis. Right vertebral artery dominant. Right PICA patent proximally. Left PICA not visualized. Basilar artery widely patent to its distal aspect. Superior cerebral arteries patent proximally. Both of the posterior cerebral arteries primarily supplied via the basilar. Right PCA patent to its distal aspect. Left P2 segment appears to bifurcate early, with poor visualization of the distal left PCA branches, although faintly visible on time-of-flight sequence. Small left P com noted. No intracranial aneurysm. IMPRESSION: 1. Negative intracranial MRA for large vessel occlusion. No proximal high-grade or correctable stenosis identified. 2. Early bifurcation of the left P\2 segment with poor visualization of the distal left PCA branches. 3. Distal small vessel atheromatous irregularity. Electronically Signed   By: Rise Mu M.D.   On: 05/09/2017 17:59    Lab Results: Basic Metabolic Panel: Recent Labs    05/09/17 1006  NA 142  K 4.0  CL 106  CO2 25  GLUCOSE 125*  BUN 14  CREATININE 1.00  CALCIUM 9.1   Liver Function Tests: No results for input(s): AST, ALT, ALKPHOS, BILITOT, PROT, ALBUMIN in the last 72 hours.   CBC: Recent Labs    05/09/17 1006  WBC 6.8  NEUTROABS 5.0  HGB 13.5  HCT 41.7  MCV 90.7  PLT 239  No results found for this or any previous visit (from the  past 240 hour(s)).   Hospital Course: This is a 82 year old who had fairly recent onset of significant hypertension.  She was seen in my office and treated and improved but later developed ataxic gait.  She is had episodes of "inner ear" and she thought that is what this was but she continued to have trouble so she came back to my office.  She did not have any focal neurological findings but because of concerns that she might of had a stroke she had an MRI which did reveal a stroke.  She had echocardiogram and had carotid Doppler done.  Echo showed left ventricular hypertrophy good systolic function and grade 2 diastolic dysfunction.  Carotid ultrasound showed greater than 70% proximal right internal carotid artery stenosis and less than 70% left internal carotid artery stenosis.  She had PT evaluation and it was felt that she did not need physical therapy at this point.  She was back at baseline and discharged home.  Discharge Exam: Blood pressure (!) 148/63, pulse (!) 53, temperature 97.8 F (36.6 C), temperature source Oral, resp. rate 18, height  (1.676 m), weight 69.8 kg (153 lb 14.1 oz), SpO2 100 %. She is awake and alert.  Chest is clear.  Heart is regular.  Disposition: Home.  She will need follow-up with vascular surgery    Follow-up Information    Kari Baars, MD Follow up in 1 week(s).   Specialty:  Pulmonary Disease Contact information: [address] [place] [address] [phone]           Signed: Khamron Gellert L   05/12/2017, 8:53 AM

## 2017-05-14 ENCOUNTER — Other Ambulatory Visit: Payer: Self-pay

## 2017-05-14 DIAGNOSIS — I6523 Occlusion and stenosis of bilateral carotid arteries: Secondary | ICD-10-CM

## 2017-06-17 ENCOUNTER — Ambulatory Visit (HOSPITAL_COMMUNITY)
Admission: RE | Admit: 2017-06-17 | Discharge: 2017-06-17 | Disposition: A | Discharge status: Home / Self Care | Payer: Medicare HMO | Source: Ambulatory Visit | Attending: Vascular Surgery | Admitting: Vascular Surgery

## 2017-06-17 DIAGNOSIS — I6523 Occlusion and stenosis of bilateral carotid arteries: Secondary | ICD-10-CM | POA: Insufficient documentation

## 2017-06-20 ENCOUNTER — Ambulatory Visit (INDEPENDENT_AMBULATORY_CARE_PROVIDER_SITE_OTHER): Payer: Medicare HMO | Admitting: Vascular Surgery

## 2017-06-20 ENCOUNTER — Other Ambulatory Visit: Payer: Self-pay

## 2017-06-20 ENCOUNTER — Encounter: Payer: Self-pay | Admitting: Vascular Surgery

## 2017-06-20 VITALS — BP 143/76 | HR 77 | Temp 96.4°F | Resp 14 | Ht 66.5 in | Wt 151.0 lb

## 2017-06-20 DIAGNOSIS — I6523 Occlusion and stenosis of bilateral carotid arteries: Secondary | ICD-10-CM

## 2017-06-20 MED ORDER — CLOPIDOGREL BISULFATE 75 MG PO TABS: 75.0000 mg | ORAL_TABLET | Freq: Every day | ORAL | 6 refills | Status: DC

## 2017-06-20 NOTE — Progress Notes (Signed)
Vitals:   06/20/17 0939 06/20/17 0948  BP: (!) 145/69 138/70  Pulse: 77 77  Resp: 14   Temp: (!) 96.4 F (35.8 C)   TempSrc: Oral   SpO2: 99%   Weight: 151 lb (68.5 kg)   Height: 5' 6.5" (1.689 m)

## 2017-06-20 NOTE — Progress Notes (Signed)
Patient ID: Sarah Nelson, female   DOB: 1935/03/08, 82 y.o.   MRN: [id number]  Reason for Consult: New Patient (Initial Visit) (right carotid)   Referred by Kari Baars, MD  Subjective:     HPI:  Sarah Nelson is a 82 y.o. female with history of a left MCA stroke the timing of which is complex.  She was noted to have a stroke in early May but states that she was developing symptoms prior to this.  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  At the time of her stroke she underwent an MRI as well as carotid artery duplexes which demonstrated high-grade stenosis on the right but not the left.  She was on aspirin at the time of stroke which was 81 mg she was then increased to 325 mg.  She does take a statin drug and was taking this before as well.  She does not have any residual deficits although she does have some balance issues but these were stable since before the stroke.  She is sent to see neurology next month.  Past Medical History:  Diagnosis Date  . High cholesterol   . Hypertension   . Thyroid disease    Family History  Problem Relation Age of Onset  . Stroke Mother   . Hypertension Mother   . Cancer Father   . Cancer Brother    Past Surgical History:  Procedure Laterality Date  . APPENDECTOMY    . MENISCUS REPAIR Right   . ROTATOR CUFF REPAIR Right     Short Social History:  Social History   Tobacco Use  . Smoking status: Never Smoker  . Smokeless tobacco: Never Used  Substance Use Topics  . Alcohol use: Never    Frequency: Never    No Known Allergies  Current Outpatient Medications  Medication Sig Dispense Refill  . aspirin 325 MG tablet Take 1 tablet (325 mg total) by mouth daily. 30 tablet 12  . levothyroxine (SYNTHROID, LEVOTHROID) 137 MCG tablet Take 137 mcg by mouth daily.  3  . metoprolol tartrate (LOPRESSOR) 100 MG tablet Check blood pressure at  home twice a day and if your blood pressure is greater than 180 systolic then take 1/2 tablet 30 tablet 12  . naproxen sodium (ALEVE) 220 MG tablet Take 1 tablet by mouth daily as needed.    . rosuvastatin (CRESTOR) 5 MG tablet Take 1 tablet by mouth daily.    Marland Kitchen amLODipine (NORVASC) 5 MG tablet Take 5 mg by mouth daily.  5  . clopidogrel (PLAVIX) 75 MG tablet Take 1 tablet (75 mg total) by mouth daily. 30 tablet 6   No current facility-administered medications for this visit.     Review of Systems  Constitutional:  Constitutional negative. HENT: HENT negative.  Eyes: Positive for visual disturbance.   Cardiovascular: Cardiovascular negative.  GI: Gastrointestinal negative.  Musculoskeletal: Musculoskeletal negative.  Skin: Skin negative.  Hematologic: Hematologic/lymphatic negative.  Psychiatric: Psychiatric negative.        Objective:  Objective   Vitals:   06/20/17 0939 06/20/17 0948 06/20/17 0949  BP: (!) 145/69 138/70 (!) 143/76  Pulse: 77 77 77  Resp: 14    Temp: (!) 96.4 F (35.8 C)    TempSrc: Oral    SpO2: 99%    Weight: 151 lb (68.5 kg)    Height: 5' 6.5" (1.689 m)     Body  mass index is 24.01 kg/m.  Physical Exam  Constitutional: She is oriented to person, place, and time. She appears well-developed.  HENT:  Head: Normocephalic.  Eyes: Pupils are equal, round, and reactive to light.  Neck: Normal range of motion. Neck supple.  Cardiovascular: Normal rate.  Pulmonary/Chest: Effort normal.  Abdominal: Soft. She exhibits no mass.  Musculoskeletal: Normal range of motion. She exhibits no edema.  Neurological: She is alert and oriented to person, place, and time. No cranial nerve deficit or sensory deficit.  Skin: Skin is warm and dry. Capillary refill takes less than 2 seconds.  Psychiatric: She has a normal mood and affect. Her behavior is normal. Judgment and thought content normal.    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  She is said to see neurology next  month.  Her only intervention since her stroke has been to increase her aspirin from 81 to 325 mg.  Given that she may have ongoing symptoms I discussed with her an aggressive approach which would be proceeding with CT angiogram of both her head and her neck.  We will also place her on dual antiplatelet therapy at this time Plavix which I have sent to her pharmacy and will reduce her back to 81 mg of aspirin and she is already taking a statin.  I will see her in 2 to 3 weeks with CT scan.  She demonstrates good understanding.  If she has any further symptoms I have urged her to seek emergent medical evaluation.     Maeola HarmanBrandon Christopher Jamillia Closson MD [facility]

## 2017-06-23 ENCOUNTER — Encounter: Payer: Self-pay | Admitting: Pulmonary Disease

## 2017-07-21 ENCOUNTER — Encounter: Payer: Self-pay | Admitting: *Deleted

## 2017-07-23 ENCOUNTER — Ambulatory Visit: Payer: Medicare HMO | Admitting: Diagnostic Neuroimaging

## 2017-07-23 ENCOUNTER — Encounter: Payer: Self-pay | Admitting: Diagnostic Neuroimaging

## 2017-07-23 VITALS — BP 143/77 | HR 82 | Ht 66.5 in | Wt 153.6 lb

## 2017-07-23 DIAGNOSIS — I63312 Cerebral infarction due to thrombosis of left middle cerebral artery: Secondary | ICD-10-CM | POA: Diagnosis not present

## 2017-07-23 NOTE — Progress Notes (Signed)
[facility]  PATIENT: Sarah Nelson DOB: 06-07-1935  REFERRING CLINICIAN: Rulon SeraE Hawkins HISTORY FROM: patient  REASON FOR VISIT: new consult    HISTORICAL  CHIEF COMPLAINT:  Chief Complaint  Patient presents with  . NP  Dr. Juanetta GoslingHawkins  . Cerebrovascular Accident    Had stroke ?  (may 3 MRI found stroke),  R carotid 75%blockage Dr. Randie Heinzain.      HISTORY OF PRESENT ILLNESS:   82 year old right-handed female here for evaluation of stroke.  In April 2019 patient was having new onset of gait difficulty and balance difficulty.  At the same time patient was having increasing blood pressure readings.  In May 2019 outpatient MRI of the brain was ordered which showed acute left brain subcortical ischemic infarction.  Patient was sent to the emergency room for evaluation.  Patient was admitted for stroke work-up.  Aspirin 81 mg was increased to 325 mg.  Patient was found to have bilateral ICA stenosis, right greater than 70%, left 50 to 69%.  This was followed up by outpatient vascular surgery clinic appointment.  Patient has CTA of head and neck scheduled.  Otherwise since that time patient is doing well.  REVIEW OF SYSTEMS: Full 14 system review of systems performed and negative with exception of: Fatigue dizziness snoring.  ALLERGIES: No Known Allergies  HOME MEDICATIONS: Outpatient Medications Prior to Visit  Medication Sig Dispense Refill  . amLODipine (NORVASC) 5 MG tablet Take 5 mg by mouth daily.  5  . aspirin EC 81 MG tablet Take 81 mg by mouth daily.    . clopidogrel (PLAVIX) 75 MG tablet Take 1 tablet (75 mg total) by mouth daily. 30 tablet 6  . levothyroxine (SYNTHROID, LEVOTHROID) 137 MCG tablet Take 137 mcg by mouth daily.  3  . rosuvastatin (CRESTOR) 5 MG tablet Take 1 tablet by mouth daily.    Marland Kitchen. aspirin 325 MG tablet Take 1 tablet (325 mg total) by mouth daily. 30 tablet 12  . metoprolol tartrate (LOPRESSOR) 100 MG tablet Check blood pressure at home  twice a day and if your blood pressure is greater than 180 systolic then take 1/2 tablet 30 tablet 12  . naproxen sodium (ALEVE) 220 MG tablet Take 1 tablet by mouth daily as needed.     No facility-administered medications prior to visit.     PAST MEDICAL HISTORY: Past Medical History:  Diagnosis Date  . High cholesterol   . Hypertension   . Stroke (HCC) 05/2017  . Thyroid disease     PAST SURGICAL HISTORY: Past Surgical History:  Procedure Laterality Date  . APPENDECTOMY    . MENISCUS REPAIR Right   . ROTATOR CUFF REPAIR Right     FAMILY HISTORY: Family History  Problem Relation Age of Onset  . Stroke Mother   . Hypertension Mother   . Cancer Father   . Cancer Brother     SOCIAL HISTORY:  Social History   Socioeconomic History  . Marital status: Married    Spouse name: Not on file  . Number of children: Not on file  . Years of education: Not on file  . Highest education level: Not on file  Occupational History  . Not on file  Social Needs  . Financial resource strain: Not on file  . Food insecurity:    Worry: Not on file    Inability: Not on file  . Transportation needs:    Medical: Not on file    Non-medical: Not on file  Tobacco Use  . Smoking status: Never Smoker  . Smokeless tobacco: Never Used  Substance and Sexual Activity  . Alcohol use: Never    Frequency: Never  . Drug use: Never  . Sexual activity: Not on file  Lifestyle  . Physical activity:    Days per week: Not on file    Minutes per session: Not on file  . Stress: Not on file  Relationships  . Social connections:    Talks on phone: Not on file    Gets together: Not on file    Attends religious service: Not on file    Active member of club or organization: Not on file    Attends meetings of clubs or organizations: Not on file    Relationship status: Not on file  . Intimate partner violence:    Fear of current or ex partner: Not on file    Emotionally abused: Not on file     Physically abused: Not on file    Forced sexual activity: Not on file  Other Topics Concern  . Not on file  Social History Narrative  . Not on file     PHYSICAL EXAM  GENERAL EXAM/CONSTITUTIONAL: Vitals:  Vitals:   07/23/17 0947  BP: (!) 143/77  Pulse: 82  Weight: 153 lb 9.6 oz (69.7 kg)  Height: 5' 6.5" (1.689 m)     Body mass index is 24.42 kg/m.  Visual Acuity Screening   Right eye Left eye Both eyes  Without correction:     With correction: 20/70 20/70      Patient is in no distress; well developed, nourished and groomed; neck is supple  CARDIOVASCULAR:  Examination of carotid arteries is normal; no carotid bruits  Regular rate and rhythm, no murmurs  Examination of peripheral vascular system by observation and palpation is normal  EYES:  Ophthalmoscopic exam of optic discs and posterior segments is normal; no papilledema or hemorrhages  MUSCULOSKELETAL:  Gait, strength, tone, movements noted in Neurologic exam below  NEUROLOGIC: MENTAL STATUS:  No flowsheet data found.  awake, alert, oriented to person, place and time  recent and remote memory intact  normal attention and concentration  language fluent, comprehension intact, naming intact,   fund of knowledge appropriate  CRANIAL NERVE:   2nd - no papilledema on fundoscopic exam  2nd, 3rd, 4th, 6th - pupils equal and reactive to light, visual fields full to confrontation, extraocular muscles intact, no nystagmus  5th - facial sensation symmetric  7th - facial strength symmetric  8th - hearing intact  9th - palate elevates symmetrically, uvula midline  11th - shoulder shrug symmetric  12th - tongue protrusion midline  MOTOR:   normal bulk and tone, full strength in the BUE, BLE; EXCEPT RIGHT HIP FLEXION 4/5; SLIGHTLY SLOWER FINGER TAP ON RIGHT HAND  SENSORY:   normal and symmetric to light touch, temperature, vibration  COORDINATION:   finger-nose-finger, fine finger  movements normal  REFLEXES:   deep tendon reflexes TRACE and symmetric  GAIT/STATION:   narrow based gait; romberg is negative    DIAGNOSTIC DATA (LABS, IMAGING, TESTING) - I reviewed patient records, labs, notes, testing and imaging myself where available.  Lab Results  Component Value Date   WBC 6.8 05/09/2017   HGB 13.5 05/09/2017   HCT 41.7 05/09/2017   MCV 90.7 05/09/2017   PLT 239 05/09/2017      Component Value Date/Time   NA 142 05/09/2017 1006   K 4.0 05/09/2017 1006  CL 106 05/09/2017 1006   CO2 25 05/09/2017 1006   GLUCOSE 125 (H) 05/09/2017 1006   BUN 14 05/09/2017 1006   CREATININE 1.00 05/09/2017 1006   CALCIUM 9.1 05/09/2017 1006   GFRNONAA 52 (L) 05/09/2017 1006   GFRAA >60 05/09/2017 1006   Lab Results  Component Value Date   CHOL 139 05/10/2017   HDL 46 05/10/2017   LDLCALC 72 05/10/2017   TRIG 105 05/10/2017   CHOLHDL 3.0 05/10/2017   Lab Results  Component Value Date   HGBA1C 5.6 05/10/2017   No results found for: VITAMINB12 No results found for: TSH  05/10/17 carotid u/s - Significant atherosclerotic plaque at the level of both carotid bulbs and proximal internal carotid arteries, right greater than left. - Estimated proximal right ICA stenosis is greater than 70%. - Estimated proximal left ICA stenosis is 50-69%.  05/09/17 MRI brain [I reviewed images myself and agree with interpretation. -VRP]  - Acute deep white matter LEFT MCA lenticulostriate territory infarct. No visible hemorrhage. - Atrophy with mild to moderate small vessel disease.  05/09/17 MRA head [I reviewed images myself and agree with interpretation. -VRP]  1. Negative intracranial MRA for large vessel occlusion. No proximal high-grade or correctable stenosis identified. 2. Early bifurcation of the left P\2 segment with poor visualization of the distal left PCA branches. 3. Distal small vessel atheromatous irregularity.  05/10/17 TTE - Left ventricle: The cavity size was  normal. Wall thickness was   increased in a pattern of mild LVH. Systolic function was   vigorous. The estimated ejection fraction was in the range of 65%   to 70%. Wall motion was normal; there were no regional wall   motion abnormalities. Features are consistent with a pseudonormal   left ventricular filling pattern, with concomitant abnormal   relaxation and increased filling pressure (grade 2 diastolic   dysfunction). Indeterminate filling pressures. - Aortic valve: Trileaflet; mildly thickened, mildly calcified   leaflets. There was no stenosis. - Left atrium: The atrium was mildly dilated. - Atrial septum: There was increased thickness of the septum,   consistent with lipomatous hypertrophy. No defect or patent   foramen ovale was identified.     ASSESSMENT AND PLAN  82 y.o. year old female here with new onset left brain subcortical ischemic infarction, likely due to small vessel thrombosis.  Patient has had diagnosis of left ischemic optic neuropathy and vision disturbance in June 2019.  Also with bilateral ICA stenosis.   Dx: left brain subcortical stroke (likely small vessel thrombosis) + right ICA stenosis (asymptomatic, >70%) + left ICA stenosis (possibly symptomatic with left brain stroke, h/o left ischemic optic neuropathy)  1. Cerebrovascular accident (CVA) due to thrombosis of left middle cerebral artery (HCC)      PLAN:  - continue dual anti-platelet therapy (for 3 months post stroke, then may reduce to single antiplatelet), statin, BP control - follow up CTA head / neck and vascular surgery follow up - consider sleep study (due to stroke and history of snoring and HTN)  Return if symptoms worsen or fail to improve, for return to PCP.    Suanne Marker, MD 07/23/2017, 10:03 AM Certified in Neurology, Neurophysiology and Neuroimaging  [facility] [address], [address] [place], [place] [address] [phone]

## 2017-07-23 NOTE — Patient Instructions (Signed)
-   continue dual anti-platelet therapy (for 3 months after stroke), statin, BP control  - follow up CTA head / neck  - consider sleep study (due to stroke and history of snoring and HTN)

## 2017-08-01 ENCOUNTER — Encounter: Payer: Self-pay | Admitting: Vascular Surgery

## 2017-08-01 ENCOUNTER — Other Ambulatory Visit: Payer: Self-pay

## 2017-08-01 ENCOUNTER — Ambulatory Visit: Payer: Medicare HMO | Admitting: Vascular Surgery

## 2017-08-01 ENCOUNTER — Ambulatory Visit
Admission: RE | Admit: 2017-08-01 | Discharge: 2017-08-01 | Disposition: A | Discharge status: Home / Self Care | Payer: Medicare HMO | Source: Ambulatory Visit | Attending: Vascular Surgery | Admitting: Vascular Surgery

## 2017-08-01 VITALS — BP 155/73 | HR 78 | Resp 18 | Ht 66.5 in | Wt 154.0 lb

## 2017-08-01 DIAGNOSIS — I6523 Occlusion and stenosis of bilateral carotid arteries: Secondary | ICD-10-CM | POA: Diagnosis not present

## 2017-08-01 MED ORDER — IOPAMIDOL (ISOVUE-370) INJECTION 76%
75.0000 mL | Freq: Once | INTRAVENOUS | Status: AC | PRN
  Administered 2017-08-01: 75 mL via INTRAVENOUS

## 2017-08-01 NOTE — Progress Notes (Signed)
Patient ID: Sarah Nelson, female   DOB: [birth year], 82 y.o.   MRN: [id number]  Reason for Consult: Follow-up (2-4 wk f/u )   Referred by [name], Edward, MD  Subjective:     HPI:  Sarah RecordHazel D Nelson is a 82 y.o. female had a left MCA stroke timing of which was unknown.  Subsequently underwent carotid Doppler studies which showed high-grade stenosis on the right but not the left.  She is placed on dual antiplatelet therapy and she is was already taking a statin.  She has had no further neurologic incidents.  Stroke work-up was determined with finding she had high blood pressure.  She is somewhat controlling her blood pressure medically at this time.  She had a CTA prior to today's visit.  Past Medical History:  Diagnosis Date  . High cholesterol   . Hypertension   . Stroke (HCC) 05/2017  . Thyroid disease    Family History  Problem Relation Age of Onset  . Stroke Mother   . Hypertension Mother   . Cancer Father   . Cancer Brother        brain tumor  . Hypertension Paternal Grandmother   . Diabetes Paternal Grandfather    Past Surgical History:  Procedure Laterality Date  . APPENDECTOMY    . cataracts  2018   removed  . MENISCUS REPAIR Right   . ROTATOR CUFF REPAIR Right     Short Social History:  Social History   Tobacco Use  . Smoking status: Never Smoker  . Smokeless tobacco: Never Used  Substance Use Topics  . Alcohol use: Never    Frequency: Never    No Known Allergies  Current Outpatient Medications  Medication Sig Dispense Refill  . amLODipine (NORVASC) 5 MG tablet Take 5 mg by mouth daily.  5  . aspirin EC 81 MG tablet Take 81 mg by mouth daily.    . clopidogrel (PLAVIX) 75 MG tablet Take 1 tablet (75 mg total) by mouth daily. 30 tablet 6  . levothyroxine (SYNTHROID, LEVOTHROID) 137 MCG tablet Take 137 mcg by mouth daily.  3  . rosuvastatin (CRESTOR) 5 MG tablet Take 1 tablet by mouth daily.     No current facility-administered medications  for this visit.     Review of Systems  Constitutional:  Constitutional negative. Eyes: Eyes negative.  Respiratory: Respiratory negative.  Cardiovascular: Cardiovascular negative.  GI: Gastrointestinal negative.  Musculoskeletal: Musculoskeletal negative.  Skin: Skin negative.  Neurological: Neurological negative. Hematologic: Hematologic/lymphatic negative.  Psychiatric: Psychiatric negative.        Objective:  Objective   Vitals:   08/01/17 1316 08/01/17 1320  BP: (!) 154/71 (!) 155/73  Pulse: 78   Resp: 18   SpO2: 98%   Weight: 154 lb (69.9 kg)   Height: 5' 6.5" (1.689 m)    Body mass index is 24.48 kg/m.  Physical Exam  Constitutional: She is oriented to person, place, and time. She appears well-developed.  HENT:  Head: Normocephalic.  Eyes: Pupils are equal, round, and reactive to light.  Neck: Neck supple.  Cardiovascular:  Pulses:      Radial pulses are 2+ on the right side, and 2+ on the left side.       Popliteal pulses are 2+ on the right side, and 2+ on the left side.  Pulmonary/Chest: Effort normal.  Abdominal: Soft.  Musculoskeletal: Normal range of motion. She exhibits no edema.  Neurological: She is alert and oriented to person, place, and time.  No cranial nerve deficit.  Skin: Skin is warm and dry.  Psychiatric: She has a normal mood and affect. Her behavior is normal. Judgment and thought content normal.    Data: IMPRESSION: 1. 60-70% stenosis of the distal right common carotid artery just proximal to the carotid bifurcation. 2. No distal tandem right ICA stenosis. 3. Atherosclerotic changes in the proximal left internal carotid artery without a significant stenosis. 4. Minimal atherosclerotic changes within the cavernous internal carotid artery and at the dural margin of the right vertebral artery. 5. Otherwise normal CTA circle-of-Willis without significant proximal stenosis, aneurysm, or branch vessel occlusion. 6. Expected evolution  of deep white matter infarct involving the left hemisphere peer 7. Stable white matter disease otherwise. 8. Multilevel degenerative changes in the cervical spine as described.      I reviewed the CT scan with the patient demonstrating the areas of stenosis.  Assessment/Plan:     82 year old female follows up with CT angios to further evaluate her carotid stenosis.  She does have higher stenosis on the right side but her previous MCA stroke was on the left.  We will follow her in 6 months with repeat carotid duplex given the severity and the recent symptoms.  I have instructed her on the signs and symptoms of stroke and TIA.  She demonstrates good understanding we will get her set up for a six-month follow-up.     Maeola Harman MD [facility]

## 2017-08-25 ENCOUNTER — Other Ambulatory Visit: Payer: Self-pay

## 2017-08-25 DIAGNOSIS — I6523 Occlusion and stenosis of bilateral carotid arteries: Secondary | ICD-10-CM

## 2018-01-16 ENCOUNTER — Other Ambulatory Visit: Payer: Self-pay

## 2018-01-16 ENCOUNTER — Ambulatory Visit (HOSPITAL_COMMUNITY)
Admission: RE | Admit: 2018-01-16 | Discharge: 2018-01-16 | Disposition: A | Discharge status: Home / Self Care | Payer: Medicare HMO | Source: Ambulatory Visit | Attending: Vascular Surgery | Admitting: Vascular Surgery

## 2018-01-16 ENCOUNTER — Ambulatory Visit (INDEPENDENT_AMBULATORY_CARE_PROVIDER_SITE_OTHER): Payer: Medicare HMO | Admitting: Vascular Surgery

## 2018-01-16 ENCOUNTER — Encounter: Payer: Self-pay | Admitting: Vascular Surgery

## 2018-01-16 VITALS — BP 147/76 | HR 66 | Temp 97.4°F | Resp 20 | Ht 66.5 in | Wt 154.0 lb

## 2018-01-16 DIAGNOSIS — I6523 Occlusion and stenosis of bilateral carotid arteries: Secondary | ICD-10-CM

## 2018-01-16 MED ORDER — CLOPIDOGREL BISULFATE 75 MG PO TABS
75.0000 mg | ORAL_TABLET | Freq: Every day | ORAL | 11 refills | Status: DC
Start: 2018-01-16 — End: 2019-02-10

## 2018-01-16 NOTE — Progress Notes (Signed)
Patient ID: Sarah Nelson, female   DOB: [birth year], 83 y.o.   MRN: [id number]  Reason for Consult: Follow-up (6 month carotid)   Referred by Sarah Nelson, Edward, MD  Subjective:     HPI:  Sarah Nelson is a 83 y.o. female previously had a left MCA stroke.  She was found to have right ICA stenosis.  She has not had any further symptoms.  She remains on dual antiplatelet therapy and statin.  She is having back and leg pain but does not have any claudication type symptoms no wound care issues.  Overall she has been fine with no changes in her medical history since her last visit.  Past Medical History:  Diagnosis Date  . High cholesterol   . Hypertension   . Stroke (HCC) 05/2017  . Thyroid disease    Family History  Problem Relation Age of Onset  . Stroke Mother   . Hypertension Mother   . Cancer Father   . Cancer Brother        brain tumor  . Hypertension Paternal Grandmother   . Diabetes Paternal Grandfather    Past Surgical History:  Procedure Laterality Date  . APPENDECTOMY    . cataracts  2018   removed  . MENISCUS REPAIR Right   . ROTATOR CUFF REPAIR Right     Short Social History:  Social History   Tobacco Use  . Smoking status: Never Smoker  . Smokeless tobacco: Never Used  Substance Use Topics  . Alcohol use: Never    Frequency: Never    No Known Allergies  Current Outpatient Medications  Medication Sig Dispense Refill  . amLODipine (NORVASC) 5 MG tablet Take 5 mg by mouth daily.  5  . aspirin EC 81 MG tablet Take 81 mg by mouth daily.    . clopidogrel (PLAVIX) 75 MG tablet Take 1 tablet (75 mg total) by mouth daily. 30 tablet 6  . levothyroxine (SYNTHROID, LEVOTHROID) 137 MCG tablet Take 137 mcg by mouth daily.  3  . rosuvastatin (CRESTOR) 5 MG tablet Take 1 tablet by mouth daily.     No current facility-administered medications for this visit.     Review of Systems  Constitutional:  Constitutional negative. HENT: HENT negative.    Eyes: Eyes negative.  Respiratory: Respiratory negative.  Cardiovascular: Cardiovascular negative.  GI: Gastrointestinal negative.  Musculoskeletal: Positive for back pain and leg pain.  Neurological: Neurological negative. Hematologic: Hematologic/lymphatic negative.  Psychiatric: Psychiatric negative.        Objective:  Objective   Vitals:   01/16/18 1524 01/16/18 1527  BP: 138/76 (!) 147/76  Pulse: 66   Resp: 20   Temp: (!) 97.4 F (36.3 C)   SpO2: 99%   Weight: 154 lb (69.9 kg)   Height: 5' 6.5" (1.689 m)    Body mass index is 24.48 kg/m.  Physical Exam Constitutional:      Appearance: Normal appearance.  HENT:     Head: Normocephalic.  Eyes:     Pupils: Pupils are equal, round, and reactive to light.  Neck:     Musculoskeletal: Normal range of motion and neck supple. No neck rigidity.  Cardiovascular:     Rate and Rhythm: Normal rate.     Pulses:          Popliteal pulses are 2+ on the right side and 2+ on the left side.  Abdominal:     General: Abdomen is flat.     Palpations: Abdomen is soft.  Musculoskeletal: Normal range of motion.        General: No swelling.  Skin:    General: Skin is warm and dry.  Neurological:     General: No focal deficit present.     Mental Status: She is alert.  Psychiatric:        Mood and Affect: Mood normal.        Behavior: Behavior normal.        Thought Content: Thought content normal.        Judgment: Judgment normal.     Data: I have independently interpreted her carotid duplex which demonstrates right 60-79 percent stenosis in left 1 to 39% stenosis.     Assessment/Plan:     83 year old female follows up for left MCA stroke of unknown time frame.  She does have known right ICA stenosis.  She is on dual antiplatelet therapy and I have refilled her Plavix today.  She also takes a statin.  She is having some back pain does not appear to be vascular in nature given palpable popliteal pulses.  She is going to  seek referral for surgical evaluation and she should be cleared from a vascular standpoint.  I will see her back in 1 year with repeat carotid duplex should she not have issues before.     Maeola Harman MD [facility]

## 2018-08-04 ENCOUNTER — Other Ambulatory Visit: Payer: Self-pay

## 2018-08-04 ENCOUNTER — Ambulatory Visit (HOSPITAL_COMMUNITY)
Admission: RE | Admit: 2018-08-04 | Discharge: 2018-08-04 | Disposition: A | Discharge status: Home / Self Care | Payer: Medicare HMO | Source: Ambulatory Visit | Attending: Pulmonary Disease | Admitting: Pulmonary Disease

## 2018-08-04 ENCOUNTER — Other Ambulatory Visit (HOSPITAL_COMMUNITY): Payer: Self-pay | Admitting: Pulmonary Disease

## 2018-08-04 DIAGNOSIS — M5440 Lumbago with sciatica, unspecified side: Secondary | ICD-10-CM | POA: Insufficient documentation

## 2018-08-21 ENCOUNTER — Other Ambulatory Visit: Payer: Self-pay | Admitting: Pulmonary Disease

## 2018-08-21 ENCOUNTER — Other Ambulatory Visit (HOSPITAL_COMMUNITY): Payer: Self-pay | Admitting: Pulmonary Disease

## 2018-08-21 DIAGNOSIS — M545 Low back pain, unspecified: Secondary | ICD-10-CM

## 2018-08-26 ENCOUNTER — Ambulatory Visit (HOSPITAL_COMMUNITY)
Admission: RE | Admit: 2018-08-26 | Discharge: 2018-08-26 | Disposition: A | Discharge status: Home / Self Care | Payer: Medicare HMO | Source: Ambulatory Visit | Attending: Pulmonary Disease | Admitting: Pulmonary Disease

## 2018-08-26 ENCOUNTER — Other Ambulatory Visit: Payer: Self-pay

## 2018-08-26 DIAGNOSIS — M545 Low back pain, unspecified: Secondary | ICD-10-CM

## 2018-10-12 ENCOUNTER — Telehealth: Payer: Self-pay

## 2018-10-12 NOTE — Telephone Encounter (Signed)
-----   Message from Waynetta Sandy, MD sent at 10/12/2018  3:56 PM EDT ----- Regarding: RE: Off of Plavix for Lumbar Fusion Okay to hold Plavix for requested time period.   Erlene Quan ----- Message ----- From: Penni Homans, RN Sent: 10/12/2018   3:20 PM EDT To: Waynetta Sandy, MD Subject: Off of Plavix for Lumbar Fusion                This patient is having a lumbar fusion and Dr. Rigoberto Noel, from [facility], wants to know can she come off of her Plavix 7 days prior and remain off 7-10 days after or does she need a bridge.   Thanks, Lattie Haw

## 2018-11-04 HISTORY — PX: LUMBAR FUSION: SHX111

## 2019-01-13 ENCOUNTER — Other Ambulatory Visit: Payer: Self-pay

## 2019-01-13 ENCOUNTER — Ambulatory Visit (HOSPITAL_COMMUNITY): Payer: Medicare HMO | Attending: Nurse Practitioner | Admitting: Physical Therapy

## 2019-01-13 ENCOUNTER — Encounter (HOSPITAL_COMMUNITY): Payer: Self-pay | Admitting: Physical Therapy

## 2019-01-13 DIAGNOSIS — R2689 Other abnormalities of gait and mobility: Secondary | ICD-10-CM | POA: Diagnosis present

## 2019-01-13 DIAGNOSIS — R29898 Other symptoms and signs involving the musculoskeletal system: Secondary | ICD-10-CM | POA: Insufficient documentation

## 2019-01-13 NOTE — Therapy (Signed)
[facility] [address] [place], [place], [address] Phone: [phone]   Fax:  [phone]  Physical Therapy Evaluation  Patient Details  Name: Sarah Nelson MRN: [id number] Date of Birth: 1935-04-08 Referring Provider (PT): Mindi Junker MD   Encounter Date: 01/13/2019  PT End of Session - 01/13/19 1534    Visit Number  1    Number of Visits  16    Date for PT Re-Evaluation  03/12/19    Authorization Type  [employer] MEDICARE    Authorization Time Period  01/13/19-03/12/19    Authorization - Visit Number  1    Authorization - Number of Visits  10    PT Start Time  1120    PT Stop Time  1205    PT Time Calculation (min)  45 min    Activity Tolerance  Patient tolerated treatment well    Behavior During Therapy  [facility] for tasks assessed/performed       Past Medical History:  Diagnosis Date  . High cholesterol   . Hypertension   . Stroke (HCC) 05/2017  . Thyroid disease     Past Surgical History:  Procedure Laterality Date  . APPENDECTOMY    . cataracts  2018   removed  . MENISCUS REPAIR Right   . ROTATOR CUFF REPAIR Right     There were no vitals filed for this visit.   Subjective Assessment - 01/13/19 1121    Subjective  Patient presents to physical therapy with complaint of lumbar/ back stiffness and core weakness s/p lumbar fusion 11/04/18. Patient says she had home health therapy to work on her legs and walking, but notes that she still has significant stiffness and restriction in her back with daily activity. Patient denies pain, and sx of numbness or tingling in LEs.    Pertinent History  lumbar fusion 11/04/18; HX of CVA in 2019    Limitations  Lifting;House hold activities    How long can you stand comfortably?  No problem    How long can you walk comfortably?  No problem    Patient Stated Goals  Get to feeling normal, not have to ask husband to do it for me    Currently in Pain?  No/denies         [facility] PT  Assessment - 01/13/19 0001      Assessment   Medical Diagnosis  Lumbar stiffness/ core weakness s/p lumbar fusion     Referring Provider (PT)  Mindi Junker MD    Onset Date/Surgical Date  11/04/18    Next MD Visit  02/24/19    Prior Therapy  No      Precautions   Precautions  Back;Other (comment)   10lb lifting restriction      Restrictions   Weight Bearing Restrictions  No      Balance Screen   Has the patient fallen in the past 6 months  No    Has the patient had a decrease in activity level because of a fear of falling?   No    Is the patient reluctant to leave their home because of a fear of falling?   No      Home Environment   Living Environment  Private residence    Living Arrangements  Spouse/significant other      Prior Function   Level of Independence  Independent      Cognition   Overall Cognitive Status  Within Functional Limits for tasks assessed  Observation/Other Assessments   Observations  Surgical incision appears intact and to be well healed    Focus on Therapeutic Outcomes (FOTO)   57% limitation      Sensation   Light Touch  Appears Intact      ROM / Strength   AROM / PROM / Strength  AROM;Strength      AROM   Overall AROM Comments  --   Bilateral hip and knee AROM WFL   AROM Assessment Site  Lumbar    Lumbar Flexion  50% limited    Lumbar Extension  25% limited    Lumbar - Right Side Bend  50% limited    Lumbar - Left Side Bend  50% limited      Strength   Strength Assessment Site  Hip;Knee;Ankle    Right/Left Hip  Right;Left    Right Hip Flexion  4+/5    Right Hip Extension  4/5    Right Hip ABduction  4+/5    Left Hip Flexion  4+/5    Left Hip Extension  4/5    Left Hip ABduction  5/5    Right/Left Knee  Right;Left    Right Knee Flexion  5/5    Right Knee Extension  5/5    Left Knee Flexion  5/5    Left Knee Extension  5/5    Right/Left Ankle  Right;Left    Right Ankle Dorsiflexion  4+/5    Left Ankle Dorsiflexion  5/5       Flexibility   Soft Tissue Assessment /Muscle Length  yes    Piriformis  Min/mod restriction bilaterally     Quadratus Lumborum  Min/mod restriction bilaterally       Palpation   Palpation comment  Moderate tenderness to palpation about bilateral lumbar paraspinals      Special Tests    Special Tests  --   (-) SLR test bilateral; (-) LLD     Ambulation/Gait   Ambulation/Gait  Yes    Ambulation/Gait Assistance  6: Modified independent (Device/Increase time)    Assistive device  None    Gait Pattern  Decreased step length - right;Decreased stance time - right;Trendelenburg    Ambulation Surface  Level;Indoor      Balance   Balance Assessed  Yes      Static Standing Balance   Static Standing Balance -  Activities   Tandam Stance - Right Leg;Tandam Stance - Left Leg;Single Leg Stance - Right Leg;Single Leg Stance - Left Leg    Static Standing - Comment/# of Minutes  30 sec; 30 sec; 15 sec; 6 sec                Objective measurements completed on examination: See above findings.      [facility] Adult PT Treatment/Exercise - 01/13/19 0001      Exercises   Exercises  Lumbar      Lumbar Exercises: Stretches   Lower Trunk Rotation  5 reps;10 seconds      Lumbar Exercises: Supine   Ab Set  10 reps;5 seconds             PT Education - 01/13/19 1140    Education Details  Patient educated on evaluation findings, POC and HEP    Person(s) Educated  Patient    Methods  Explanation;Handout    Comprehension  Verbalized understanding       PT Short Term Goals - 01/13/19 1542      PT SHORT TERM GOAL #1  Title  Patient will be independent with initial HEP to improve functional outcomes    Time  4    Period  Weeks    Status  New    Target Date  02/12/19      PT SHORT TERM GOAL #2   Title  Patient will improve FOTO score to <50% to indicate improvement in functional outcomes    Time  4    Period  Weeks    Status  New    Target Date  02/12/19        PT Long  Term Goals - 01/13/19 1543      PT LONG TERM GOAL #1   Title  Patient will improve FOTO score to <45% to indicate improvement in functional outcomes    Time  8    Period  Weeks    Status  New    Target Date  03/12/19      PT LONG TERM GOAL #2   Title  Patient will report at least 75% overall improvement in subjective complaint to indicate improvement in ability to perform ADLs.    Time  8    Period  Weeks    Status  New    Target Date  03/12/19      PT LONG TERM GOAL #3   Title  Patient will have equal to or > 4+/5 MMT throughout BLEs to improve ability to perform functional mobility, stair ambulation and ADLs.    Time  8    Period  Weeks    Status  New    Target Date  03/12/19      PT LONG TERM GOAL #4   Title  Patient will be able to maintain sinlg limb stance >30 seconds on BLEs to demo improved stability and reduced risk for falls    Time  8    Period  Weeks    Status  New    Target Date  03/12/19      PT LONG TERM GOAL #5   Title  Patient will have improved lumbar flexibility in order to squat down to lift grocery bags or other items from floor, with good form and postural awareness, no pain    Time  8    Period  Weeks    Status  New    Target Date  03/12/19             Plan - 01/13/19 1536    Clinical Impression Statement  Patient is an 84 y.o. female who presents to physical therapy with complaint of low back stiffness and core weakness s/p lumbar fusion on 11/04/18. Patient demonstrates decreased strength, ROM and flexibility restriction, balance deficits, increased tenderness to palpation and gait abnormalities which are negatively impacting patient ability to perform ADLs and functional mobility tasks. Patient will benefit from skilled physical therapy services to address these deficits to improve level of function with ADLs, functional mobility tasks, and return patient to prior level of function.    Personal Factors and Comorbidities  Age     Examination-Activity Limitations  Bend;Lift;Caring for Others;Dressing;Squat    Examination-Participation Restrictions  Community Activity;A[facility]ose    Stability/Clinical Decision Making  Stable/Uncomplicated    Clinical Decision Making  Low    Rehab Potential  Good    PT Frequency  2x / week    PT Duration  8 weeks    PT Treatment/Interventions  ADLs/Self Care Home Management;Therapeutic exercise;Therapeutic activities;Patient/family education;Functional mobility training;Gait training;Stair training;Balance training;Neuromuscular re-education;Passive range  of motion;Taping;Manual techniques;Joint Manipulations;Spinal Manipulations;Scar mobilization    PT Next Visit Plan  Initiate treatment, review goals and HEP. Add manual treatmetn to address restrictions in lumbar spine. Add ab marching, piriformis stretch, hip bridge, progress core strength and gentle lumbar ROM as tolerated.    PT Home Exercise Plan  01/13/19: ab set, LTR    Consulted and Agree with Plan of Care  Patient       Patient will benefit from skilled therapeutic intervention in order to improve the following deficits and impairments:  Abnormal gait, Increased fascial restricitons, Improper body mechanics, Decreased range of motion, Decreased strength, Hypomobility, Impaired flexibility, Decreased balance  Visit Diagnosis: Other abnormalities of gait and mobility  Other symptoms and signs involving the musculoskeletal system     Problem List Patient Active Problem List   Diagnosis Date Noted  . CVA (cerebral vascular accident) (HCC) 05/09/2017  . HTN (hypertension) 05/09/2017  . Hypothyroidism 05/09/2017   3:52 PM, 01/13/19 Georges Lynch PT DPT  Physical Therapist with [place]  [facility]  [phone]   [facility] [address] [place], [place], [address] Phone: [phone]   Fax:  [phone]  Name: Sarah Nelson MRN: [id number] Date  of Birth: Apr 26, 1935

## 2019-01-19 ENCOUNTER — Ambulatory Visit (HOSPITAL_COMMUNITY): Payer: Medicare HMO | Admitting: Physical Therapy

## 2019-01-19 ENCOUNTER — Other Ambulatory Visit: Payer: Self-pay

## 2019-01-19 ENCOUNTER — Encounter (HOSPITAL_COMMUNITY): Payer: Self-pay | Admitting: Physical Therapy

## 2019-01-19 DIAGNOSIS — R29898 Other symptoms and signs involving the musculoskeletal system: Secondary | ICD-10-CM

## 2019-01-19 DIAGNOSIS — R2689 Other abnormalities of gait and mobility: Secondary | ICD-10-CM

## 2019-01-19 NOTE — Therapy (Signed)
[place], [place], [address] Phone: [phone]   Fax:  [phone]  Physical Therapy Treatment  Patient Details  Name: Sarah Nelson MRN: [id number] Date of Birth: Jun 07, 1935 Referring Provider (PT): Gaspar Cola MD   Encounter Date: 01/19/2019  PT End of Session - 01/19/19 1040    Visit Number  2    Number of Visits  16    Date for PT Re-Evaluation  03/12/19    Authorization Type  [employer]ET[employer] MEDICARE    Authorization Time Period  01/13/19-03/12/19    Authorization - Visit Number  2    Authorization - Number of Visits  10    PT Start Time  1037    PT Stop Time  1117    PT Time Calculation (min)  40 min    Activity Tolerance  Patient tolerated treatment well    Behavior During Therapy  [facility] for tasks assessed/performed       Past Medical History:  Diagnosis Date  . High cholesterol   . Hypertension   . Stroke ([place]) 05/2017  . Thyroid disease     Past Surgical History:  Procedure Laterality Date  . APPENDECTOMY    . cataracts  2018   removed  . MENISCUS REPAIR Right   . ROTATOR CUFF REPAIR Right     There were no vitals filed for this visit.  Subjective Assessment - 01/19/19 1040    Subjective  Patient says she has been doing home exercises with no issues. Patient says she feels "stiffness, no pain".    Pertinent History  lumbar fusion 11/04/18; HX of CVA in 2019    Limitations  Lifting;House hold activities    How long can you stand comfortably?  No problem    How long can you walk comfortably?  No problem    Patient Stated Goals  Get to feeling normal, not have to ask husband to do it for me    Currently in Pain?  No/denies                       [facility] Adult PT Treatment/Exercise - 01/19/19 0001      Lumbar Exercises: Stretches   Single Knee to Chest Stretch  Right;Left;5 reps;10 seconds    Lower Trunk Rotation  5 reps;10 seconds    Prone on Elbows Stretch  2 reps;60  seconds    Piriformis Stretch  Right;Left;5 reps;10 seconds      Lumbar Exercises: Supine   Ab Set  10 reps;5 seconds    Bridge  10 reps;5 seconds    Other Supine Lumbar Exercises  ab marching, alternating, x20      Manual Therapy   Manual Therapy  Soft tissue mobilization    Manual therapy comments  All manuals performed separate from other activity     Soft tissue mobilization  IASTM to bilateral lumbar paraspinals, patient in prone               PT Short Term Goals - 01/13/19 1542      PT SHORT TERM GOAL #1   Title  Patient will be independent with initial HEP to improve functional outcomes    Time  4    Period  Weeks    Status  New    Target Date  02/12/19      PT SHORT TERM GOAL #2   Title  Patient will improve FOTO score to <50% to indicate  improvement in functional outcomes    Time  4    Period  Weeks    Status  New    Target Date  02/12/19        PT Long Term Goals - 01/13/19 1543      PT LONG TERM GOAL #1   Title  Patient will improve FOTO score to <45% to indicate improvement in functional outcomes    Time  8    Period  Weeks    Status  New    Target Date  03/12/19      PT LONG TERM GOAL #2   Title  Patient will report at least 75% overall improvement in subjective complaint to indicate improvement in ability to perform ADLs.    Time  8    Period  Weeks    Status  New    Target Date  03/12/19      PT LONG TERM GOAL #3   Title  Patient will have equal to or > 4+/5 MMT throughout BLEs to improve ability to perform functional mobility, stair ambulation and ADLs.    Time  8    Period  Weeks    Status  New    Target Date  03/12/19      PT LONG TERM GOAL #4   Title  Patient will be able to maintain sinlg limb stance >30 seconds on BLEs to demo improved stability and reduced risk for falls    Time  8    Period  Weeks    Status  New    Target Date  03/12/19      PT LONG TERM GOAL #5   Title  Patient will have improved lumbar flexibility in  order to squat down to lift grocery bags or other items from floor, with good form and postural awareness, no pain    Time  8    Period  Weeks    Status  New    Target Date  03/12/19            Plan - 01/19/19 1223    Clinical Impression Statement  Patient tolerated session well today. Reviewed goals and HEP. Patient showed good form and return of home exercises. Patient did require cueing for pacing of activity and holding stretches for desired times. Patient able to progress core strength and lumbar mobility exercise with no increased complaint of pain. Patient educated on proper form and function of all added exercise. Patient noted decreased feeling of stiffness, and improved lumbar mobility post manual treatment.    Personal Factors and Comorbidities  Age    Examination-Activity Limitations  Bend;Lift;Caring for Others;Dressing;Squat    Examination-Participation Restrictions  Community Activity;Pinc[facility] B[facility]    Stability/Clinical Decision Making  Stable/Uncomplicated    Rehab Potential  Good    PT Frequency  2x / week    PT Duration  8 weeks    PT Treatment/Interventions  ADLs/Self Care Home Management;Therapeutic exercise;Therapeutic activities;Patient/family education;Functional mobility training;Gait training;Stair training;Balance training;Neuromuscular re-education;Passive range of motion;Taping;Manual techniques;Joint Manipulations;Spinal Manipulations;Scar mobilization    PT Next Visit Plan  Continue to progress core strength and lumbar mobility as tolerated. Add thoracic excursions next visit. Continue manual for lumbar restrictions.    PT Home Exercise Plan  01/13/19: ab set, LTR    Consulted and Agree with Plan of Care  Patient       Patient will benefit from skilled therapeutic intervention in order to improve the following deficits and impairments:  Abnormal gait, Increased  fascial restricitons, Improper body mechanics, Decreased range of motion, Decreased strength,  Hypomobility, Impaired flexibility, Decreased balance  Visit Diagnosis: Other abnormalities of gait and mobility  Other symptoms and signs involving the musculoskeletal system     Problem List Patient Active Problem List   Diagnosis Date Noted  . CVA (cerebral vascular accident) (HCC) 05/09/2017  . HTN (hypertension) 05/09/2017  . Hypothyroidism 05/09/2017   12:28 PM, 01/19/19 Georges Lynch PT DPT  Physical Therapist with [place]  [facility]  [phone]   [facility] [address] [place], [place], [address] Phone: [phone]   Fax:  [phone]  Name: Sarah Nelson MRN: [id number] Date of Birth: 09/06/1935

## 2019-01-21 ENCOUNTER — Other Ambulatory Visit: Payer: Self-pay

## 2019-01-21 ENCOUNTER — Ambulatory Visit (HOSPITAL_COMMUNITY): Payer: Medicare HMO | Admitting: Physical Therapy

## 2019-01-21 ENCOUNTER — Encounter (HOSPITAL_COMMUNITY): Payer: Self-pay | Admitting: Physical Therapy

## 2019-01-21 DIAGNOSIS — R29898 Other symptoms and signs involving the musculoskeletal system: Secondary | ICD-10-CM

## 2019-01-21 DIAGNOSIS — R2689 Other abnormalities of gait and mobility: Secondary | ICD-10-CM | POA: Diagnosis not present

## 2019-01-21 NOTE — Therapy (Signed)
[facility] [address] [place], [place], [address] Phone: [phone]   Fax:  [phone]  Physical Therapy Treatment  Patient Details  Name: Sarah Nelson MRN: [id number] Date of Birth: 08-04-1935 Referring Provider (PT): Mindi Junker MD   Encounter Date: 01/21/2019  PT End of Session - 01/21/19 1035    Visit Number  3    Number of Visits  16    Date for PT Re-Evaluation  03/12/19    Authorization Type  AETNA MEDICARE    Authorization Time Period  01/13/19-03/12/19    Authorization - Visit Number  3    Authorization - Number of Visits  10    PT Start Time  1030    PT Stop Time  1110    PT Time Calculation (min)  40 min    Activity Tolerance  Patient tolerated treatment well    Behavior During Therapy  [facility] for tasks assessed/performed       Past Medical History:  Diagnosis Date  . High cholesterol   . Hypertension   . Stroke (HCC) 05/2017  . Thyroid disease     Past Surgical History:  Procedure Laterality Date  . APPENDECTOMY    . cataracts  2018   removed  . MENISCUS REPAIR Right   . ROTATOR CUFF REPAIR Right     There were no vitals filed for this visit.  Subjective Assessment - 01/21/19 1035    Subjective  Patient says she was sore after last visit, but reports no pain. Patient says she has been doing exercises at home and are going well, and that she was able to walk 3 miles yesterday.    Pertinent History  lumbar fusion 11/04/18; HX of CVA in 2019    Limitations  Lifting;House hold activities    How long can you stand comfortably?  No problem    How long can you walk comfortably?  No problem    Patient Stated Goals  Get to feeling normal, not have to ask husband to do it for me    Currently in Pain?  No/denies                       [facility] Adult PT Treatment/Exercise - 01/21/19 0001      Lumbar Exercises: Stretches   Double Knee to Chest Stretch  5 reps;10 seconds    Double Knee to  Chest Stretch Limitations  with swiss ball    Lower Trunk Rotation  5 reps;10 seconds    Lower Trunk Rotation Limitations  with swiss ball    Prone on Elbows Stretch  2 reps;60 seconds    Piriformis Stretch  Right;Left;3 reps;30 seconds      Lumbar Exercises: Seated   Other Seated Lumbar Exercises  Thoracic excursions 3D 5x each       Lumbar Exercises: Supine   Ab Set  10 reps;5 seconds    Bridge  10 reps;5 seconds    Other Supine Lumbar Exercises  Lv 2 ab marching, alternating, x20      Manual Therapy   Manual Therapy  Soft tissue mobilization    Manual therapy comments  All manuals performed separate from other activity     Soft tissue mobilization  IASTM to bilateral lumbar paraspinals, patient in prone               PT Short Term Goals - 01/13/19 1542      PT SHORT TERM GOAL #  1   Title  Patient will be independent with initial HEP to improve functional outcomes    Time  4    Period  Weeks    Status  New    Target Date  02/12/19      PT SHORT TERM GOAL #2   Title  Patient will improve FOTO score to <50% to indicate improvement in functional outcomes    Time  4    Period  Weeks    Status  New    Target Date  02/12/19        PT Long Term Goals - 01/13/19 1543      PT LONG TERM GOAL #1   Title  Patient will improve FOTO score to <45% to indicate improvement in functional outcomes    Time  8    Period  Weeks    Status  New    Target Date  03/12/19      PT LONG TERM GOAL #2   Title  Patient will report at least 75% overall improvement in subjective complaint to indicate improvement in ability to perform ADLs.    Time  8    Period  Weeks    Status  New    Target Date  03/12/19      PT LONG TERM GOAL #3   Title  Patient will have equal to or > 4+/5 MMT throughout BLEs to improve ability to perform functional mobility, stair ambulation and ADLs.    Time  8    Period  Weeks    Status  New    Target Date  03/12/19      PT LONG TERM GOAL #4   Title   Patient will be able to maintain sinlg limb stance >30 seconds on BLEs to demo improved stability and reduced risk for falls    Time  8    Period  Weeks    Status  New    Target Date  03/12/19      PT LONG TERM GOAL #5   Title  Patient will have improved lumbar flexibility in order to squat down to lift grocery bags or other items from floor, with good form and postural awareness, no pain    Time  8    Period  Weeks    Status  New    Target Date  03/12/19            Plan - 01/21/19 1113    Clinical Impression Statement  Patient tolerated session well today. Was able to progress to level 2 ab marching with no complaints though did note increased muscle fatigue in abdominals. Patient with good return for table exercise and stretching performed previously. Progressed spinal mobility to thoracic excursions 3D, patient educate on proper form and function. Patient also able to progress LTR with ball, and DKTC with ball for increased lumbar mobility and core strengthening.    Personal Factors and Comorbidities  Age    Examination-Activity Limitations  Bend;Lift;Caring for Others;Dressing;Squat    Examination-Participation Restrictions  Community Activity;Pincus Badder [facility] [place]    Stability/Clinical Decision Making  Stable/Uncomplicated    Rehab Potential  Good    PT Frequency  2x / week    PT Duration  8 weeks    PT Treatment/Interventions  ADLs/Self Care Home Management;Therapeutic exercise;Therapeutic activities;Patient/family education;Functional mobility training;Gait training;Stair training;Balance training;Neuromuscular re-education;Passive range of motion;Taping;Manual techniques;Joint Manipulations;Spinal Manipulations;Scar mobilization    PT Next Visit Plan  Continue to progress core strength and lumbar mobility as  tolerated. Continue manual for lumbar restrictions.    PT Home Exercise Plan  01/13/19: ab set, LTR    Consulted and Agree with Plan of Care  Patient       Patient will  benefit from skilled therapeutic intervention in order to improve the following deficits and impairments:  Abnormal gait, Increased fascial restricitons, Improper body mechanics, Decreased range of motion, Decreased strength, Hypomobility, Impaired flexibility, Decreased balance  Visit Diagnosis: Other abnormalities of gait and mobility  Other symptoms and signs involving the musculoskeletal system     Problem List Patient Active Problem List   Diagnosis Date Noted  . CVA (cerebral vascular accident) ([place]) 05/09/2017  . HTN (hypertension) 05/09/2017  . Hypothyroidism 05/09/2017    11:15 AM, 01/21/19 Josue Hector PT DPT  Physical Therapist with [facility]  [phone] [place] [address] [place], [place], [address] Phone: [phone]   Fax:  [phone]  Name: Sarah Nelson MRN: [id number] Date of Birth: [birth year]

## 2019-01-28 ENCOUNTER — Encounter (HOSPITAL_COMMUNITY): Payer: Self-pay | Admitting: Physical Therapy

## 2019-01-28 ENCOUNTER — Ambulatory Visit (HOSPITAL_COMMUNITY): Payer: Medicare HMO | Admitting: Physical Therapy

## 2019-01-28 ENCOUNTER — Other Ambulatory Visit: Payer: Self-pay

## 2019-01-28 DIAGNOSIS — R2689 Other abnormalities of gait and mobility: Secondary | ICD-10-CM | POA: Diagnosis not present

## 2019-01-28 DIAGNOSIS — R29898 Other symptoms and signs involving the musculoskeletal system: Secondary | ICD-10-CM

## 2019-01-28 NOTE — Therapy (Signed)
[facility] [address] [place], [place], [address] Phone: [phone]   Fax:  [phone]  Physical Therapy Treatment  Patient Details  Name: Sarah Nelson MRN: [id number] Date of Birth: 14-Sep-1935 Referring Provider (PT): Mindi Junker MD   Encounter Date: 01/28/2019  PT End of Session - 01/28/19 1036    Visit Number  4    Number of Visits  16    Date for PT Re-Evaluation  03/12/19    Authorization Type  AET[employer] MEDICARE    Authorization Time Period  01/13/19-03/12/19    Authorization - Visit Number  4    Authorization - Number of Visits  10    PT Start Time  1032    PT Stop Time  1115    PT Time Calculation (min)  43 min    Activity Tolerance  Patient tolerated treatment well    Behavior During Therapy  [facility] for tasks assessed/performed       Past Medical History:  Diagnosis Date  . High cholesterol   . Hypertension   . Stroke (HCC) 05/2017  . Thyroid disease     Past Surgical History:  Procedure Laterality Date  . APPENDECTOMY    . cataracts  2018   removed  . MENISCUS REPAIR Right   . ROTATOR CUFF REPAIR Right     There were no vitals filed for this visit.  Subjective Assessment - 01/28/19 1034    Subjective  Patient says she was sore after last session, says she has been doing her exercise but took yesterday off because she was sore. Patient denies any change in activity level. Patient reports no pain currently" just stiff".    Pertinent History  lumbar fusion 11/04/18; HX of CVA in 2019    Limitations  Lifting;House hold activities    How long can you stand comfortably?  No problem    How long can you walk comfortably?  No problem    Patient Stated Goals  Get to feeling normal, not have to ask husband to do it for me    Currently in Pain?  No/denies                       [facility] Adult PT Treatment/Exercise - 01/28/19 0001      Lumbar Exercises: Stretches   Double Knee to Chest Stretch  5  reps;10 seconds    Double Knee to Chest Stretch Limitations  with swiss ball    Lower Trunk Rotation  5 reps;10 seconds    Lower Trunk Rotation Limitations  with swiss ball    Prone on Elbows Stretch  2 reps;60 seconds    Piriformis Stretch  Right;Left;3 reps;30 seconds      Lumbar Exercises: Seated   Sit to Stand  10 reps   from low mat   Other Seated Lumbar Exercises  Thoracic excursions 3D 5x each       Lumbar Exercises: Supine   Ab Set  10 reps;5 seconds    Bridge  10 reps;5 seconds    Other Supine Lumbar Exercises  Lv 2 ab marching, alternating, x20      Manual Therapy   Manual Therapy  Soft tissue mobilization    Manual therapy comments  All manuals performed separate from other activity     Soft tissue mobilization  IASTM to bilateral lumbar paraspinals, patient in prone               PT  Short Term Goals - 01/13/19 1542      PT SHORT TERM GOAL #1   Title  Patient will be independent with initial HEP to improve functional outcomes    Time  4    Period  Weeks    Status  New    Target Date  02/12/19      PT SHORT TERM GOAL #2   Title  Patient will improve FOTO score to <50% to indicate improvement in functional outcomes    Time  4    Period  Weeks    Status  New    Target Date  02/12/19        PT Long Term Goals - 01/13/19 1543      PT LONG TERM GOAL #1   Title  Patient will improve FOTO score to <45% to indicate improvement in functional outcomes    Time  8    Period  Weeks    Status  New    Target Date  03/12/19      PT LONG TERM GOAL #2   Title  Patient will report at least 75% overall improvement in subjective complaint to indicate improvement in ability to perform ADLs.    Time  8    Period  Weeks    Status  New    Target Date  03/12/19      PT LONG TERM GOAL #3   Title  Patient will have equal to or > 4+/5 MMT throughout BLEs to improve ability to perform functional mobility, stair ambulation and ADLs.    Time  8    Period  Weeks     Status  New    Target Date  03/12/19      PT LONG TERM GOAL #4   Title  Patient will be able to maintain sinlg limb stance >30 seconds on BLEs to demo improved stability and reduced risk for falls    Time  8    Period  Weeks    Status  New    Target Date  03/12/19      PT LONG TERM GOAL #5   Title  Patient will have improved lumbar flexibility in order to squat down to lift grocery bags or other items from floor, with good form and postural awareness, no pain    Time  8    Period  Weeks    Status  New    Target Date  03/12/19            Plan - 01/28/19 1116    Clinical Impression Statement  Patient tolerated session well today, noted no increased pain. Patient did require cues for hold times during stretching, and for relaxing hips into table during POE stretch for gentle lumbar extension ROM. Patient noted decreased stiffness and discomfort in lumbar post manual treatment. Added sit to stand for BLE strengthening, patient cued on form for use of legs and maintaining neutral spine. Patient required verbal cues and demo for proper form with thoracic excursion series.    Personal Factors and Comorbidities  Age    Examination-Activity Limitations  Bend;Lift;Caring for Others;Dressing;Squat    Examination-Participation Restrictions  Community Activity;[facility]    Stability/Clinical Decision Making  Stable/Uncomplicated    Rehab Potential  Good    PT Frequency  2x / week    PT Duration  8 weeks    PT Treatment/Interventions  ADLs/Self Care Home Management;Therapeutic exercise;Therapeutic activities;Patient/family education;Functional mobility training;Gait training;Stair training;Balance training;Neuromuscular re-education;Passive range of motion;Taping;Manual  techniques;Joint Manipulations;Spinal Manipulations;Scar mobilization    PT Next Visit Plan  Continue to progress core strength and lumbar mobility as tolerated. Continue manual for lumbar restrictions. Add palloff press  next session if able.    PT Home Exercise Plan  01/13/19: ab set, LTR    Consulted and Agree with Plan of Care  Patient       Patient will benefit from skilled therapeutic intervention in order to improve the following deficits and impairments:  Abnormal gait, Increased fascial restricitons, Improper body mechanics, Decreased range of motion, Decreased strength, Hypomobility, Impaired flexibility, Decreased balance  Visit Diagnosis: Other abnormalities of gait and mobility  Other symptoms and signs involving the musculoskeletal system     Problem List Patient Active Problem List   Diagnosis Date Noted  . CVA (cerebral vascular accident) (HCC) 05/09/2017  . HTN (hypertension) 05/09/2017  . Hypothyroidism 05/09/2017    11:17 AM, 01/28/19 Georges Lynch PT DPT  Physical Therapist with [place]  [facility]  [phone]   [facility] [address] [place], [place], [address] Phone: [phone]   Fax:  [phone]  Name: Sarah Nelson MRN: [id number] Date of Birth: 03-28-1935

## 2019-02-01 ENCOUNTER — Encounter (HOSPITAL_COMMUNITY): Payer: Self-pay | Admitting: Physical Therapy

## 2019-02-01 ENCOUNTER — Ambulatory Visit (HOSPITAL_COMMUNITY): Payer: Medicare HMO | Admitting: Physical Therapy

## 2019-02-01 ENCOUNTER — Other Ambulatory Visit: Payer: Self-pay

## 2019-02-01 DIAGNOSIS — R2689 Other abnormalities of gait and mobility: Secondary | ICD-10-CM

## 2019-02-01 DIAGNOSIS — R29898 Other symptoms and signs involving the musculoskeletal system: Secondary | ICD-10-CM

## 2019-02-01 NOTE — Therapy (Signed)
[facility] [address] [place], [place], [address] Phone: [phone]   Fax:  [phone]  Physical Therapy Treatment  Patient Details  Name: Sarah Nelson MRN: [id number] Date of Birth: 1935-09-19 Referring Provider (PT): Mindi Junker MD   Encounter Date: 02/01/2019  PT End of Session - 02/01/19 1438    Visit Number  5    Number of Visits  16    Date for PT Re-Evaluation  03/12/19    Authorization Type  AETNA MEDICARE    Authorization Time Period  01/13/19-03/12/19    Authorization - Visit Number  5    Authorization - Number of Visits  10    PT Start Time  1432    PT Stop Time  1515    PT Time Calculation (min)  43 min    Activity Tolerance  Patient tolerated treatment well    Behavior During Therapy  [facility] for tasks assessed/performed       Past Medical History:  Diagnosis Date  . High cholesterol   . Hypertension   . Stroke (HCC) 05/2017  . Thyroid disease     Past Surgical History:  Procedure Laterality Date  . APPENDECTOMY    . cataracts  2018   removed  . MENISCUS REPAIR Right   . ROTATOR CUFF REPAIR Right     There were no vitals filed for this visit.  Subjective Assessment - 02/01/19 1438    Subjective  Patient reports ongoing stiffness in low back and hips, says she was somewhat sore after last visit. Says she did some of her exercises and some walking today. Patient reports no pain currently.    Pertinent History  lumbar fusion 11/04/18; HX of CVA in 2019    Limitations  Lifting;House hold activities    How long can you stand comfortably?  No problem    How long can you walk comfortably?  No problem    Patient Stated Goals  Get to feeling normal, not have to ask husband to do it for me    Currently in Pain?  No/denies                       [facility] Adult PT Treatment/Exercise - 02/01/19 0001      Lumbar Exercises: Stretches   Double Knee to Chest Stretch  5 reps;10 seconds    Double  Knee to Chest Stretch Limitations  with swiss ball    Lower Trunk Rotation  5 reps;10 seconds    Lower Trunk Rotation Limitations  with swiss ball    Prone on Elbows Stretch  2 reps;60 seconds    Piriformis Stretch  Right;Left;3 reps;30 seconds      Lumbar Exercises: Standing   Other Standing Lumbar Exercises  Palloff press, red band, x10 each       Lumbar Exercises: Seated   Sit to Stand  10 reps    Other Seated Lumbar Exercises  Thoracic excursions 3D 5x each       Lumbar Exercises: Supine   Ab Set  10 reps;5 seconds    Dead Bug  10 reps    Bridge  10 reps;5 seconds    Other Supine Lumbar Exercises  Lv 2 ab marching, alternating, x20      Manual Therapy   Manual Therapy  Soft tissue mobilization    Manual therapy comments  All manuals performed separate from other activity     Soft tissue mobilization  IASTM  to bilateral lumbar paraspinals, patient in prone               PT Short Term Goals - 01/13/19 1542      PT SHORT TERM GOAL #1   Title  Patient will be independent with initial HEP to improve functional outcomes    Time  4    Period  Weeks    Status  New    Target Date  02/12/19      PT SHORT TERM GOAL #2   Title  Patient will improve FOTO score to <50% to indicate improvement in functional outcomes    Time  4    Period  Weeks    Status  New    Target Date  02/12/19        PT Long Term Goals - 01/13/19 1543      PT LONG TERM GOAL #1   Title  Patient will improve FOTO score to <45% to indicate improvement in functional outcomes    Time  8    Period  Weeks    Status  New    Target Date  03/12/19      PT LONG TERM GOAL #2   Title  Patient will report at least 75% overall improvement in subjective complaint to indicate improvement in ability to perform ADLs.    Time  8    Period  Weeks    Status  New    Target Date  03/12/19      PT LONG TERM GOAL #3   Title  Patient will have equal to or > 4+/5 MMT throughout BLEs to improve ability to perform  functional mobility, stair ambulation and ADLs.    Time  8    Period  Weeks    Status  New    Target Date  03/12/19      PT LONG TERM GOAL #4   Title  Patient will be able to maintain sinlg limb stance >30 seconds on BLEs to demo improved stability and reduced risk for falls    Time  8    Period  Weeks    Status  New    Target Date  03/12/19      PT LONG TERM GOAL #5   Title  Patient will have improved lumbar flexibility in order to squat down to lift grocery bags or other items from floor, with good form and postural awareness, no pain    Time  8    Period  Weeks    Status  New    Target Date  03/12/19            Plan - 02/01/19 1535    Clinical Impression Statement  Patient is making good progress toward LTGs. Added deadbugs and palloff press for core strengthening today. Patient required verbal cueing and demo for proper form with dead bugs. Patient required cueing for core activation during palloff press. Patient tolerated session well today with no increased complaint of pain.    Personal Factors and Comorbidities  Age    Examination-Activity Limitations  Bend;Lift;Caring for Others;Dressing;Squat    Examination-Participation Restrictions  Community Activity;[facility]    Stability/Clinical Decision Making  Stable/Uncomplicated    Rehab Potential  Good    PT Frequency  2x / week    PT Duration  8 weeks    PT Treatment/Interventions  ADLs/Self Care Home Management;Therapeutic exercise;Therapeutic activities;Patient/family education;Functional mobility training;Gait training;Stair training;Balance training;Neuromuscular re-education;Passive range of motion;Taping;Manual techniques;Joint Manipulations;Spinal Manipulations;Scar mobilization  PT Next Visit Plan  Patient says she thinks she is ready for DC to home program as she is near baseline of function and is able to do most everything she wants. Review goals and DC to home program if indicated.    PT Home Exercise  Plan  01/13/19: ab set, LTR    Consulted and Agree with Plan of Care  Patient       Patient will benefit from skilled therapeutic intervention in order to improve the following deficits and impairments:  Abnormal gait, Increased fascial restricitons, Improper body mechanics, Decreased range of motion, Decreased strength, Hypomobility, Impaired flexibility, Decreased balance  Visit Diagnosis: Other abnormalities of gait and mobility  Other symptoms and signs involving the musculoskeletal system     Problem List Patient Active Problem List   Diagnosis Date Noted  . CVA (cerebral vascular accident) ([place]) 05/09/2017  . HTN (hypertension) 05/09/2017  . Hypothyroidism 05/09/2017   3:39 PM, 02/01/19 Josue Hector PT DPT  Physical Therapist with [facility]  [phone] [place] [address] [place], [place], [address] Phone: [phone]   Fax:  [phone]  Name: Sarah Nelson MRN: [id number] Date of Birth: 1935/02/18

## 2019-02-04 ENCOUNTER — Encounter (HOSPITAL_COMMUNITY): Payer: Self-pay | Admitting: Physical Therapy

## 2019-02-04 ENCOUNTER — Other Ambulatory Visit: Payer: Self-pay

## 2019-02-04 ENCOUNTER — Ambulatory Visit (HOSPITAL_COMMUNITY): Payer: Medicare HMO | Admitting: Physical Therapy

## 2019-02-04 DIAGNOSIS — R2689 Other abnormalities of gait and mobility: Secondary | ICD-10-CM | POA: Diagnosis not present

## 2019-02-04 DIAGNOSIS — R29898 Other symptoms and signs involving the musculoskeletal system: Secondary | ICD-10-CM

## 2019-02-04 NOTE — Therapy (Signed)
[place], [place], [address] Phone: [phone]   Fax:  [phone]  Physical Therapy Treatment  Patient Details  Name: Sarah Nelson MRN: [id number] Date of Birth: [birth year] Referring Provider (PT): Gaspar Cola MD   Encounter Date: 02/04/2019  PT End of Session - 02/04/19 1035    Visit Number  6    Number of Visits  16    Date for PT Re-Evaluation  03/12/19    Authorization Type  AETNA MEDICARE    Authorization Time Period  01/13/19-03/12/19    Authorization - Visit Number  6    Authorization - Number of Visits  10    PT Start Time  1030    PT Stop Time  1110    PT Time Calculation (min)  40 min    Activity Tolerance  Patient tolerated treatment well    Behavior During Therapy  [facility] for tasks assessed/performed       Past Medical History:  Diagnosis Date  . High cholesterol   . Hypertension   . Stroke ([place]) 05/2017  . Thyroid disease     Past Surgical History:  Procedure Laterality Date  . APPENDECTOMY    . cataracts  2018   removed  . MENISCUS REPAIR Right   . ROTATOR CUFF REPAIR Right     There were no vitals filed for this visit.  Subjective Assessment - 02/04/19 1036    Subjective  Patient say she still feels stiff, though feels she is gradually improving. Has continued to walk around her house, and has been compliance with daily HEP. Denies current pain, "just stiff". After discussion with patietn on POC, patient said she would like to continue 2 more visits for ther ex progressions before DC.    Pertinent History  lumbar fusion 11/04/18; HX of CVA in 2019    Limitations  Lifting;House hold activities    How long can you stand comfortably?  No problem    How long can you walk comfortably?  No problem    Patient Stated Goals  Get to feeling normal, not have to ask husband to do it for me    Currently in Pain?  No/denies                       [facility] Adult PT  Treatment/Exercise - 02/04/19 0001      Lumbar Exercises: Stretches   Lower Trunk Rotation  5 reps;10 seconds    Lower Trunk Rotation Limitations  with added thoracic rotation     Prone on Elbows Stretch  2 reps;60 seconds    Piriformis Stretch  Right;Left;3 reps;30 seconds      Lumbar Exercises: Standing   Other Standing Lumbar Exercises  Palloff press, red band, x10 each in tandem stance    Other Standing Lumbar Exercises  standing hip abd/ ext, red band 10 x ea      Lumbar Exercises: Seated   Sit to Stand  10 reps    Sit to Stand Limitations  tap to chair for depth cues    Other Seated Lumbar Exercises  --      Lumbar Exercises: Supine   Ab Set  10 reps;5 seconds    Dead Bug  10 reps    Bridge  10 reps;5 seconds      Manual Therapy   Manual Therapy  Soft tissue mobilization    Manual therapy comments  All manuals performed separate from  other activity     Soft tissue mobilization  IASTM to bilateral lumbar paraspinals, patient in prone               PT Short Term Goals - 01/13/19 1542      PT SHORT TERM GOAL #1   Title  Patient will be independent with initial HEP to improve functional outcomes    Time  4    Period  Weeks    Status  New    Target Date  02/12/19      PT SHORT TERM GOAL #2   Title  Patient will improve FOTO score to <50% to indicate improvement in functional outcomes    Time  4    Period  Weeks    Status  New    Target Date  02/12/19        PT Long Term Goals - 01/13/19 1543      PT LONG TERM GOAL #1   Title  Patient will improve FOTO score to <45% to indicate improvement in functional outcomes    Time  8    Period  Weeks    Status  New    Target Date  03/12/19      PT LONG TERM GOAL #2   Title  Patient will report at least 75% overall improvement in subjective complaint to indicate improvement in ability to perform ADLs.    Time  8    Period  Weeks    Status  New    Target Date  03/12/19      PT LONG TERM GOAL #3   Title   Patient will have equal to or > 4+/5 MMT throughout BLEs to improve ability to perform functional mobility, stair ambulation and ADLs.    Time  8    Period  Weeks    Status  New    Target Date  03/12/19      PT LONG TERM GOAL #4   Title  Patient will be able to maintain sinlg limb stance >30 seconds on BLEs to demo improved stability and reduced risk for falls    Time  8    Period  Weeks    Status  New    Target Date  03/12/19      PT LONG TERM GOAL #5   Title  Patient will have improved lumbar flexibility in order to squat down to lift grocery bags or other items from floor, with good form and postural awareness, no pain    Time  8    Period  Weeks    Status  New    Target Date  03/12/19            Plan - 02/04/19 1118    Clinical Impression Statement  Patient tolerated ther ex progressions well today with no increased complaint of pain. Patient educated on proper form with modified LTRs with thoracic rotation for improved lumbar rotation mobility. Patient required verbal cues for chair squats, taps to chair for cueing depth. Patient also required cueing for upright posturing and avoiding LE rotation during standing hip abduction with band. Patient was well challenged with added tandem stance to band resisted palloff pressing. Patient noted increased muscle fatigue post session.    Personal Factors and Comorbidities  Age    Examination-Activity Limitations  Bend;Lift;Caring for Others;Dressing;Squat    Examination-Participation Restrictions  Community Activity;[facility] B[facility]    Stability/Clinical Decision Making  Stable/Uncomplicated    Rehab Potential  Good  PT Frequency  2x / week    PT Duration  8 weeks    PT Treatment/Interventions  ADLs/Self Care Home Management;Therapeutic exercise;Therapeutic activities;Patient/family education;Functional mobility training;Gait training;Stair training;Balance training;Neuromuscular re-education;Passive range of motion;Taping;Manual  techniques;Joint Manipulations;Spinal Manipulations;Scar mobilization    PT Next Visit Plan  Continue to progress lumbar mobility and funcitonal strengthening to progress home program for DC in 2 visits. Add medicine ball deadlift from 10-12 inch box next session.    PT Home Exercise Plan  01/13/19: ab set, LTR    Consulted and Agree with Plan of Care  Patient       Patient will benefit from skilled therapeutic intervention in order to improve the following deficits and impairments:  Abnormal gait, Increased fascial restricitons, Improper body mechanics, Decreased range of motion, Decreased strength, Hypomobility, Impaired flexibility, Decreased balance  Visit Diagnosis: Other abnormalities of gait and mobility  Other symptoms and signs involving the musculoskeletal system     Problem List Patient Active Problem List   Diagnosis Date Noted  . CVA (cerebral vascular accident) (HCC) 05/09/2017  . HTN (hypertension) 05/09/2017  . Hypothyroidism 05/09/2017   11:20 AM, 02/04/19 Georges Lynch PT DPT  Physical Therapist with [place]  [facility]  [phone]   [facility] [address] [place], [place], [address] Phone: [phone]   Fax:  [phone]  Name: ABRIA VANNOSTRAND MRN: [id number] Date of Birth: 02-May-1935

## 2019-02-09 ENCOUNTER — Ambulatory Visit: Payer: Medicare HMO | Attending: Internal Medicine

## 2019-02-09 ENCOUNTER — Other Ambulatory Visit: Payer: Self-pay

## 2019-02-09 DIAGNOSIS — U071 COVID-19: Secondary | ICD-10-CM

## 2019-02-09 DIAGNOSIS — Z20822 Contact with and (suspected) exposure to covid-19: Secondary | ICD-10-CM

## 2019-02-09 HISTORY — DX: COVID-19: U07.1

## 2019-02-10 ENCOUNTER — Other Ambulatory Visit: Payer: Self-pay | Admitting: Vascular Surgery

## 2019-02-10 ENCOUNTER — Telehealth (HOSPITAL_COMMUNITY): Payer: Self-pay | Admitting: Physical Therapy

## 2019-02-10 LAB — NOVEL CORONAVIRUS, NAA: SARS-CoV-2, NAA: DETECTED — AB

## 2019-02-10 NOTE — Telephone Encounter (Signed)
pt and her husband have tested positive for COVID. She wants to cancel all of her remaining appts until after she is over this. she has appt in the23rd with her MD and if he says its ok, she will get a new referral

## 2019-02-11 ENCOUNTER — Ambulatory Visit (HOSPITAL_COMMUNITY): Payer: Medicare HMO | Admitting: Physical Therapy

## 2019-02-11 ENCOUNTER — Telehealth (HOSPITAL_COMMUNITY): Payer: Self-pay | Admitting: Nurse Practitioner

## 2019-02-11 DIAGNOSIS — U071 COVID-19: Secondary | ICD-10-CM

## 2019-02-11 NOTE — Telephone Encounter (Signed)
Called to discuss with Sarah Nelson about Covid symptoms and the use of bamlanivimab, a monoclonal antibody infusion for those with mild to moderate Covid symptoms and at a high risk of hospitalization.     Pt is qualified for this infusion at the [facility] infusion center due to co-morbid conditions and/or a member of an at-risk group, however declines infusion at this time. Symptoms tier reviewed as well as criteria for ending isolation.  Symptoms reviewed that would warrant ED/Hospital evaluation. Preventative practices reviewed. Patient verbalized understanding. Patient advised to call back if he decides that he does want to get infusion. Callback number to the infusion center given. Patient advised to go to Urgent care or ED with severe symptoms.   Patient Active Problem List   Diagnosis Date Noted  . CVA (cerebral vascular accident) (HCC) 05/09/2017  . HTN (hypertension) 05/09/2017  . Hypothyroidism 05/09/2017   Consuello Masse, DNP, AGNP-C [facility]  [email or web address] [facility] Main Line: [phone]  [phone] (Infusion Center Hotline)

## 2019-02-15 ENCOUNTER — Ambulatory Visit (HOSPITAL_COMMUNITY): Payer: Medicare HMO | Admitting: Physical Therapy

## 2019-02-18 ENCOUNTER — Ambulatory Visit (HOSPITAL_COMMUNITY): Payer: Medicare HMO | Admitting: Physical Therapy

## 2019-02-19 ENCOUNTER — Encounter (HOSPITAL_COMMUNITY): Payer: Medicare HMO

## 2019-02-19 ENCOUNTER — Ambulatory Visit: Payer: Medicare HMO | Admitting: Vascular Surgery

## 2019-02-22 ENCOUNTER — Encounter (HOSPITAL_COMMUNITY): Payer: Medicare HMO | Admitting: Physical Therapy

## 2019-02-25 ENCOUNTER — Encounter (HOSPITAL_COMMUNITY): Payer: Medicare HMO | Admitting: Physical Therapy

## 2019-03-01 ENCOUNTER — Encounter (HOSPITAL_COMMUNITY): Payer: Medicare HMO | Admitting: Physical Therapy

## 2019-03-04 ENCOUNTER — Encounter (HOSPITAL_COMMUNITY): Payer: Medicare HMO | Admitting: Physical Therapy

## 2019-03-08 ENCOUNTER — Encounter (HOSPITAL_COMMUNITY): Payer: Medicare HMO | Admitting: Physical Therapy

## 2019-03-11 ENCOUNTER — Encounter (HOSPITAL_COMMUNITY): Payer: Medicare HMO | Admitting: Physical Therapy

## 2019-03-19 ENCOUNTER — Ambulatory Visit: Payer: Medicare HMO | Admitting: Vascular Surgery

## 2019-03-19 ENCOUNTER — Encounter (HOSPITAL_COMMUNITY): Payer: Medicare HMO

## 2019-03-24 ENCOUNTER — Encounter (HOSPITAL_COMMUNITY): Payer: Self-pay | Admitting: Physical Therapy

## 2019-03-24 NOTE — Therapy (Signed)
[place], [place], [address] Phone: [phone]   Fax:  [phone]  Patient Details  Name: Sarah Nelson MRN: [id number] Date of Birth: 05-Aug-1935 Referring Provider:  No ref. provider found  Encounter Date: 03/24/2019  PHYSICAL THERAPY DISCHARGE SUMMARY  Visits from Start of Care: 6  Current functional level related to goals / functional outcomes: Unable to reassess as patient did not return for follow up visits   Remaining deficits: Unable to reassess as patient did not return for follow up visits   Education / Equipment: Patient called to cancel all remaining visits per contracting COVID virus. Last visit dated 02/04/19. Patient agreed to return to primary MD for new referral when ready to return.  Plan: Patient agrees to discharge.  Patient goals were not met. Patient is being discharged due to a change in medical status.  ?????           4:40 PM, 03/24/19 Josue Hector PT DPT  Physical Therapist with [facility]  [phone] [place] [address] [place], [place], [address] Phone: [phone]   Fax:  [phone]

## 2019-04-16 ENCOUNTER — Other Ambulatory Visit: Payer: Self-pay | Admitting: *Deleted

## 2019-04-16 DIAGNOSIS — I6523 Occlusion and stenosis of bilateral carotid arteries: Secondary | ICD-10-CM

## 2019-04-23 ENCOUNTER — Other Ambulatory Visit: Payer: Self-pay

## 2019-04-23 ENCOUNTER — Encounter: Payer: Self-pay | Admitting: Vascular Surgery

## 2019-04-23 ENCOUNTER — Ambulatory Visit: Payer: Medicare HMO | Admitting: Vascular Surgery

## 2019-04-23 ENCOUNTER — Ambulatory Visit (HOSPITAL_COMMUNITY)
Admission: RE | Admit: 2019-04-23 | Discharge: 2019-04-23 | Disposition: A | Discharge status: Home / Self Care | Payer: Medicare HMO | Source: Ambulatory Visit | Attending: Surgery | Admitting: Surgery

## 2019-04-23 VITALS — BP 178/68 | HR 70 | Temp 97.2°F | Resp 20 | Ht 66.5 in | Wt 154.0 lb

## 2019-04-23 DIAGNOSIS — I6523 Occlusion and stenosis of bilateral carotid arteries: Secondary | ICD-10-CM | POA: Insufficient documentation

## 2019-04-23 NOTE — Progress Notes (Signed)
Patient ID: Sarah Nelson, female   DOB: 08-Jun-1935, 84 y.o.   MRN: [id number]  Reason for Consult: No chief complaint on file.   Referred by Kari Baars, MD  Subjective:     HPI:  Sarah Nelson is a 84 y.o. female with history of left MCA stroke.  She has been found to have right carotid stenosis in the past by both CT scan we have followed with duplex.  Risk factors include hypertension, cholesterol and family history.  She has never had vascular surgery herself.  She recently had back surgery has been recovering from this for 5 months.  She does take aspirin, Plavix, statin.  No new amaurosis, TIA, stroke.  Past Medical History:  Diagnosis Date  . High cholesterol   . Hypertension   . Stroke (HCC) 05/2017  . Thyroid disease    Family History  Problem Relation Age of Onset  . Stroke Mother   . Hypertension Mother   . Cancer Father   . Cancer Brother        brain tumor  . Hypertension Paternal Grandmother   . Diabetes Paternal Grandfather    Past Surgical History:  Procedure Laterality Date  . APPENDECTOMY    . cataracts  2018   removed  . MENISCUS REPAIR Right   . ROTATOR CUFF REPAIR Right     Short Social History:  Social History   Tobacco Use  . Smoking status: Never Smoker  . Smokeless tobacco: Never Used  Substance Use Topics  . Alcohol use: Never    No Known Allergies  Current Outpatient Medications  Medication Sig Dispense Refill  . amLODipine (NORVASC) 5 MG tablet Take 5 mg by mouth daily.  5  . aspirin EC 81 MG tablet Take 81 mg by mouth daily.    . clopidogrel (PLAVIX) 75 MG tablet Take 1 tablet (75 mg total) by mouth daily. 30 tablet 6  . clopidogrel (PLAVIX) 75 MG tablet TAKE 1 TABLET BY MOUTH DAILY. 90 tablet 3  . levothyroxine (SYNTHROID, LEVOTHROID) 137 MCG tablet Take 137 mcg by mouth daily.  3  . rosuvastatin (CRESTOR) 5 MG tablet Take 1 tablet by mouth daily.     No current facility-administered medications for this  visit.    Review of Systems  Constitutional:  Constitutional negative. HENT: HENT negative.  Eyes: Eyes negative.  Respiratory: Respiratory negative.  Cardiovascular: Cardiovascular negative.  GI: Gastrointestinal negative.  Musculoskeletal: Positive for back pain.  Skin: Skin negative.  Neurological: Neurological negative. Hematologic: Hematologic/lymphatic negative.  Psychiatric: Psychiatric negative.        Objective:   Vitals:   04/23/19 1540 04/23/19 1542  BP: (!) 160/73 (!) 178/68  Pulse: 70   Resp: 20   Temp: (!) 97.2 F (36.2 C)   SpO2: 98%     Physical Exam HENT:     Head: Normocephalic.  Eyes:     Pupils: Pupils are equal, round, and reactive to light.  Neck:     Vascular: Carotid bruit present.     Comments: Bilateral carotid bruits Cardiovascular:     Rate and Rhythm: Normal rate.     Pulses: Normal pulses.  Pulmonary:     Effort: Pulmonary effort is normal.     Breath sounds: Normal breath sounds.  Abdominal:     General: Abdomen is flat.     Palpations: Abdomen is soft.  Musculoskeletal:        General: No swelling. Normal range of motion.  Skin:  General: Skin is warm and dry.     Capillary Refill: Capillary refill takes less than 2 seconds.  Neurological:     General: No focal deficit present.     Mental Status: She is alert.  Psychiatric:        Mood and Affect: Mood normal.        Behavior: Behavior normal.        Thought Content: Thought content normal.        Judgment: Judgment normal.     Data: I have independently interpreted her carotid duplex which demonstrates increased on the right side to greater than 80%.  This is peak systolic velocity of 300 and end-diastolic velocity of 762.  On the left side she has 40 to 59% stenosis with a peak systolic velocity of 263 and end-diastolic velocity of 63.     Assessment/Plan:     84 year old female now with greater than 80% stenosis on the right.  Left side about 50% which is  stable.  She does have bilateral carotid bruits.  She is fully medically managed.  I have given her the option of surgery, CT scan to discuss further options or continued medical management.  We will proceed with CT scan of the head and neck and then discuss options being stent or open carotid endarterectomy.  We discussed the signs and symptoms of stroke for which she would call 911.  We will otherwise see her back in 2 to 4 weeks.     Waynetta Sandy MD [facility]

## 2019-04-28 ENCOUNTER — Other Ambulatory Visit: Payer: Self-pay

## 2019-04-28 DIAGNOSIS — I6523 Occlusion and stenosis of bilateral carotid arteries: Secondary | ICD-10-CM

## 2019-05-14 ENCOUNTER — Other Ambulatory Visit: Payer: Self-pay

## 2019-05-14 ENCOUNTER — Ambulatory Visit
Admission: RE | Admit: 2019-05-14 | Discharge: 2019-05-14 | Disposition: A | Discharge status: Home / Self Care | Payer: Medicare HMO | Source: Ambulatory Visit | Attending: Vascular Surgery | Admitting: Vascular Surgery

## 2019-05-14 DIAGNOSIS — I6523 Occlusion and stenosis of bilateral carotid arteries: Secondary | ICD-10-CM

## 2019-05-14 MED ORDER — IOPAMIDOL (ISOVUE-370) INJECTION 76%
75.0000 mL | Freq: Once | INTRAVENOUS | Status: AC | PRN
  Administered 2019-05-14: 75 mL via INTRAVENOUS

## 2019-05-21 ENCOUNTER — Ambulatory Visit (INDEPENDENT_AMBULATORY_CARE_PROVIDER_SITE_OTHER): Payer: Medicare HMO | Admitting: Vascular Surgery

## 2019-05-21 ENCOUNTER — Other Ambulatory Visit: Payer: Self-pay

## 2019-05-21 ENCOUNTER — Encounter: Payer: Self-pay | Admitting: Vascular Surgery

## 2019-05-21 VITALS — BP 172/75 | HR 69 | Temp 97.6°F | Resp 20 | Ht 66.5 in | Wt 154.0 lb

## 2019-05-21 DIAGNOSIS — I6523 Occlusion and stenosis of bilateral carotid arteries: Secondary | ICD-10-CM | POA: Diagnosis not present

## 2019-05-21 NOTE — Progress Notes (Signed)
Patient ID: Sarah Nelson, female   DOB: December 14, 1935, 84 y.o.   MRN: [id number]  Reason for Consult: Follow-up   Referred by Sarah Stabile, MD  Subjective:     HPI:  Sarah Nelson is a 84 y.o. female has a history of a left MCA stroke.  We have followed her carotid artery stenosis she now has high-grade stenosis on the right has bilateral bruits noted from previous visit.  Risk factors include high cholesterol and hypertension.  Does not have any symptoms of stroke, TIA or amaurosis on the right.  She does have balance issues.  She had a CT scan 1 week ago she is here today for review.  Past Medical History:  Diagnosis Date  . Carotid artery occlusion   . COVID-19 02/09/2019  . High cholesterol   . Hypertension   . Stroke (HCC) 05/2017  . Thyroid disease    Family History  Problem Relation Age of Onset  . Stroke Mother   . Hypertension Mother   . Cancer Father   . Cancer Brother        brain tumor  . Hypertension Paternal Grandmother   . Diabetes Paternal Grandfather    Past Surgical History:  Procedure Laterality Date  . APPENDECTOMY    . cataracts  2018   removed  . LUMBAR FUSION  11/04/2018  . MENISCUS REPAIR Right   . ROTATOR CUFF REPAIR Right     Short Social History:  Social History   Tobacco Use  . Smoking status: Never Smoker  . Smokeless tobacco: Never Used  Substance Use Topics  . Alcohol use: Never    No Known Allergies  Current Outpatient Medications  Medication Sig Dispense Refill  . amLODipine (NORVASC) 5 MG tablet Take 5 mg by mouth daily.  5  . aspirin EC 81 MG tablet Take 81 mg by mouth daily.    . clopidogrel (PLAVIX) 75 MG tablet Take 1 tablet (75 mg total) by mouth daily. 30 tablet 6  . levothyroxine (SYNTHROID, LEVOTHROID) 137 MCG tablet Take 137 mcg by mouth daily.  3  . rosuvastatin (CRESTOR) 5 MG tablet Take 1 tablet by mouth daily.     No current facility-administered medications for this visit.    Review of Systems   Constitutional:  Constitutional negative. HENT: HENT negative.  Eyes: Eyes negative.  Respiratory: Respiratory negative.  Cardiovascular: Cardiovascular negative.  GI: Gastrointestinal negative.  Musculoskeletal: Musculoskeletal negative.  Neurological:       Unsteady gait Hematologic: Hematologic/lymphatic negative.  Psychiatric: Psychiatric negative.        Objective:  Objective   Vitals:   05/21/19 0915 05/21/19 0917  BP: (!) 173/75 (!) 172/75  Pulse: 69   Resp: 20   Temp: 97.6 F (36.4 C)   SpO2: 98%   Weight: 154 lb (69.9 kg)   Height: 5' 6.5" (1.689 m)    Body mass index is 24.48 kg/m.  Physical Exam HENT:     Nose:     Comments: Mask in place Eyes:     Pupils: Pupils are equal, round, and reactive to light.  Cardiovascular:     Rate and Rhythm: Normal rate.     Pulses: Normal pulses.     Heart sounds: Normal heart sounds.  Pulmonary:     Effort: Pulmonary effort is normal.  Abdominal:     General: Abdomen is flat.  Musculoskeletal:        General: No swelling. Normal range of motion.  Cervical back: Normal range of motion. No rigidity.  Skin:    General: Skin is warm and dry.     Capillary Refill: Capillary refill takes less than 2 seconds.  Neurological:     Mental Status: She is alert.  Psychiatric:        Mood and Affect: Mood normal.        Behavior: Behavior normal.        Thought Content: Thought content normal.        Judgment: Judgment normal.     Data: CTA head/neck IMPRESSION: 1. No acute intracranial abnormality. 2. Mild chronic small vessel ischemic changes. 3. Atherosclerotic changes in the distal right common carotid artery with 80% stenosis. When compared to the diameter of the distal right internal carotid artery, the stenosis would be graded at 65%, similar to prior CT angiogram. 4. Atherosclerotic changes in the left carotid bifurcation without hemodynamically significant stenosis. 5. Dominant right vertebral  artery. Atherosclerotic changes at the origin of the left vertebral artery which appear narrowed. However, evaluation for degree of stenosis is limited due to calcification and artifact from dense contrast bolus in the left brachiocephalic vein. 6. Degenerative changes of the cervical spine with moderate bilateral neural foraminal stenosis at C6-7. 7. Bilateral thyroid nodules. Recommend thyroid ultrasound.     Assessment/Plan:     84 year old female with high-grade right ICA stenosis that is asymptomatic.  I discussed the options being no intervention versus stenting either transfemoral transcarotid versus carotid endarterectomy.  We have discussed all the procedures in depth and reviewed her CT scan together with her daughter.  We will plan for right sided transcarotid artery stenting.  She will continue aspirin Plavix and statin.     Waynetta Sandy MD [facility]

## 2019-05-29 ENCOUNTER — Other Ambulatory Visit (HOSPITAL_COMMUNITY): Payer: Medicare HMO

## 2019-06-02 ENCOUNTER — Other Ambulatory Visit: Payer: Self-pay

## 2019-06-02 ENCOUNTER — Encounter (HOSPITAL_COMMUNITY)
Admission: RE | Admit: 2019-06-02 | Discharge: 2019-06-02 | Disposition: A | Discharge status: Home / Self Care | Payer: Medicare HMO | Source: Ambulatory Visit | Attending: Vascular Surgery | Admitting: Vascular Surgery

## 2019-06-02 ENCOUNTER — Encounter (HOSPITAL_COMMUNITY): Payer: Self-pay

## 2019-06-02 DIAGNOSIS — Z01812 Encounter for preprocedural laboratory examination: Secondary | ICD-10-CM | POA: Insufficient documentation

## 2019-06-02 HISTORY — DX: Unspecified osteoarthritis, unspecified site: M19.90

## 2019-06-02 LAB — TYPE AND SCREEN
ABO/RH(D): O POS
Antibody Screen: NEGATIVE

## 2019-06-02 LAB — COMPREHENSIVE METABOLIC PANEL
ALT: 11 U/L (ref 0–44)
AST: 16 U/L (ref 15–41)
Albumin: 4.1 g/dL (ref 3.5–5.0)
Alkaline Phosphatase: 90 U/L (ref 38–126)
Anion gap: 9 (ref 5–15)
BUN: 11 mg/dL (ref 8–23)
CO2: 27 mmol/L (ref 22–32)
Calcium: 9.8 mg/dL (ref 8.9–10.3)
Chloride: 104 mmol/L (ref 98–111)
Creatinine, Ser: 0.92 mg/dL (ref 0.44–1.00)
GFR calc Af Amer: >60 mL/min (ref >60)
GFR calc non Af Amer: 58 mL/min — ABNORMAL LOW (ref >60)
Glucose, Bld: 97 mg/dL (ref 70–99)
Potassium: 4.1 mmol/L (ref 3.5–5.1)
Sodium: 140 mmol/L (ref 135–145)
Total Bilirubin: 0.3 mg/dL (ref 0.3–1.2)
Total Protein: 7.3 g/dL (ref 6.5–8.1)

## 2019-06-02 LAB — URINALYSIS, ROUTINE W REFLEX MICROSCOPIC
Bilirubin Urine: NEGATIVE
Glucose, UA: NEGATIVE mg/dL
Hgb urine dipstick: NEGATIVE
Ketones, ur: NEGATIVE mg/dL
Leukocytes,Ua: NEGATIVE
Nitrite: NEGATIVE
Protein, ur: NEGATIVE mg/dL
Specific Gravity, Urine: 1.008 (ref 1.005–1.030)
pH: 7 (ref 5.0–8.0)

## 2019-06-02 LAB — CBC
HCT: 40.5 % (ref 36.0–46.0)
Hemoglobin: 12.5 g/dL (ref 12.0–15.0)
MCH: 26.2 pg (ref 26.0–34.0)
MCHC: 30.9 g/dL (ref 30.0–36.0)
MCV: 84.9 fL (ref 80.0–100.0)
Platelets: 325 10*3/uL (ref 150–400)
RBC: 4.77 MIL/uL (ref 3.87–5.11)
RDW: 15 % (ref 11.5–15.5)
WBC: 6.1 10*3/uL (ref 4.0–10.5)
nRBC: 0 % (ref 0.0–0.2)

## 2019-06-02 LAB — ABO/RH: ABO/RH(D): O POS

## 2019-06-02 LAB — APTT: aPTT: 32 seconds (ref 24–36)

## 2019-06-02 LAB — PROTIME-INR
INR: 1 (ref 0.8–1.2)
Prothrombin Time: 12.5 seconds (ref 11.4–15.2)

## 2019-06-02 LAB — SURGICAL PCR SCREEN
MRSA, PCR: NEGATIVE
Staphylococcus aureus: NEGATIVE

## 2019-06-02 NOTE — Progress Notes (Signed)
PCP - Nita Sells Cardiologist - denies  Chest x-ray - n/a EKG - requesting from [facility] Stress Test - denies ECHO - 05/10/17 Cardiac Cath - denies   Blood Thinner Instructions: continue plavix Aspirin Instructions:continue asa  COVID TEST- 06/05/19   Anesthesia review: yes, requested EKG  Patient denies shortness of breath, fever, cough and chest pain at PAT appointment   All instructions explained to the patient, with a verbal understanding of the material. Patient agrees to go over the instructions while at home for a better understanding. Patient also instructed to self quarantine after being tested for COVID-19. The opportunity to ask questions was provided.

## 2019-06-02 NOTE — Progress Notes (Signed)
[id number] - [place], [place] [address] [place] [address] Phone: [phone] Fax: [phone]      Your procedure is scheduled on Wednesday June 2  Report to [facility] Main Entrance "A" at 0800 A.M., and check in at the Admitting office.  Call this number if you have problems the morning of surgery:  [phone]  Call [phone] if you have any questions prior to your surgery date Monday-Friday 8am-4pm    Remember:  Do not eat or drink after midnight the night before your surgery     Take these medicines the morning of surgery with A SIP OF WATER  acetaminophen (TYLENOL)  If needed clopidogrel (PLAVIX) levothyroxine (SYNTHROID) rosuvastatin (CRESTOR)   Per Dr. Donzetta Matters - Continues Aspirin and Plavix as normal  As of today, STOP taking any Aleve, Naproxen, Ibuprofen, Motrin, Advil, Goody's, BC's, all herbal medications, fish oil, and all vitamins.                      Do not wear jewelry, make up, or nail polish            Do not wear lotions, powders, perfumes/colognes, or deodorant.            Do not shave 48 hours prior to surgery.  Men may shave face and neck.            Do not bring valuables to the hospital.            [facility] is not responsible for any belongings or valuables.  Do NOT Smoke (Tobacco/Vapping) or drink Alcohol 24 hours prior to your procedure If you use a CPAP at night, you may bring all equipment for your overnight stay.   Contacts, glasses, dentures or bridgework may not be worn into surgery.      For patients admitted to the hospital, discharge time will be determined by your treatment team.   Patients discharged the day of surgery will not be allowed to drive home, and someone needs to stay with them for 24 hours.    Special instructions:   [facility]- Preparing For Surgery  Before surgery, you can play an important role. Because skin is not sterile, your skin needs to be as free of  germs as possible. You can reduce the number of germs on your skin by washing with CHG (chlorahexidine gluconate) Soap before surgery.  CHG is an antiseptic cleaner which kills germs and bonds with the skin to continue killing germs even after washing.    Oral Hygiene is also important to reduce your risk of infection.  Remember - BRUSH YOUR TEETH THE MORNING OF SURGERY WITH YOUR REGULAR TOOTHPASTE  Please do not use if you have an allergy to CHG or antibacterial soaps. If your skin becomes reddened/irritated stop using the CHG.  Do not shave (including legs and underarms) for at least 48 hours prior to first CHG shower. It is OK to shave your face.  Please follow these instructions carefully.   1. Shower the NIGHT BEFORE SURGERY and the MORNING OF SURGERY with CHG Soap.   2. If you chose to wash your hair, wash your hair first as usual with your normal shampoo.  3. After you shampoo, rinse your hair and body thoroughly to remove the shampoo.  4. Use CHG as you would any other liquid soap. You can apply CHG directly to the skin and wash gently with a  scrungie or a clean washcloth.   5. Apply the CHG Soap to your body ONLY FROM THE NECK DOWN.  Do not use on open wounds or open sores. Avoid contact with your eyes, ears, mouth and genitals (private parts). Wash Face and genitals (private parts)  with your normal soap.   6. Wash thoroughly, paying special attention to the area where your surgery will be performed.  7. Thoroughly rinse your body with warm water from the neck down.  8. DO NOT shower/wash with your normal soap after using and rinsing off the CHG Soap.  9. Pat yourself dry with a CLEAN TOWEL.  10. Wear CLEAN PAJAMAS to bed the night before surgery, wear comfortable clothes the morning of surgery  11. Place CLEAN SHEETS on your bed the night of your first shower and DO NOT SLEEP WITH PETS.   Day of Surgery:   Do not apply any deodorants/lotions.  Please wear clean clothes  to the hospital/surgery center.   Remember to brush your teeth WITH YOUR REGULAR TOOTHPASTE.   Please read over the following fact sheets that you were given.

## 2019-06-05 ENCOUNTER — Other Ambulatory Visit (HOSPITAL_COMMUNITY)
Admission: RE | Admit: 2019-06-05 | Discharge: 2019-06-05 | Disposition: A | Discharge status: Home / Self Care | Payer: Medicare HMO | Source: Ambulatory Visit | Attending: Vascular Surgery | Admitting: Vascular Surgery

## 2019-06-05 DIAGNOSIS — Z01812 Encounter for preprocedural laboratory examination: Secondary | ICD-10-CM | POA: Diagnosis present

## 2019-06-05 DIAGNOSIS — Z20822 Contact with and (suspected) exposure to covid-19: Secondary | ICD-10-CM | POA: Diagnosis not present

## 2019-06-05 LAB — SARS CORONAVIRUS 2 (TAT 6-24 HRS): SARS Coronavirus 2: NEGATIVE

## 2019-06-08 ENCOUNTER — Encounter (HOSPITAL_COMMUNITY): Payer: Self-pay | Admitting: Vascular Surgery

## 2019-06-09 ENCOUNTER — Encounter (HOSPITAL_COMMUNITY): Payer: Self-pay | Admitting: Vascular Surgery

## 2019-06-09 ENCOUNTER — Inpatient Hospital Stay (HOSPITAL_COMMUNITY): Payer: Medicare HMO | Admitting: Certified Registered Nurse Anesthetist

## 2019-06-09 ENCOUNTER — Inpatient Hospital Stay (HOSPITAL_COMMUNITY): Payer: Medicare HMO | Admitting: Emergency Medicine

## 2019-06-09 ENCOUNTER — Inpatient Hospital Stay (HOSPITAL_COMMUNITY)
Admission: RE | Admit: 2019-06-09 | Discharge: 2019-06-10 | DRG: 036 | Disposition: A | Discharge status: Home / Self Care | Payer: Medicare HMO | Attending: Vascular Surgery | Admitting: Vascular Surgery

## 2019-06-09 ENCOUNTER — Inpatient Hospital Stay (HOSPITAL_COMMUNITY): Payer: Medicare HMO

## 2019-06-09 ENCOUNTER — Other Ambulatory Visit: Payer: Self-pay

## 2019-06-09 ENCOUNTER — Encounter (HOSPITAL_COMMUNITY)
Admission: RE | Disposition: A | Discharge status: Home / Self Care | Payer: Self-pay | Source: Home / Self Care | Attending: Vascular Surgery

## 2019-06-09 DIAGNOSIS — I10 Essential (primary) hypertension: Secondary | ICD-10-CM | POA: Diagnosis present

## 2019-06-09 DIAGNOSIS — Z8616 Personal history of COVID-19: Secondary | ICD-10-CM | POA: Diagnosis not present

## 2019-06-09 DIAGNOSIS — E039 Hypothyroidism, unspecified: Secondary | ICD-10-CM | POA: Diagnosis present

## 2019-06-09 DIAGNOSIS — I6529 Occlusion and stenosis of unspecified carotid artery: Secondary | ICD-10-CM | POA: Diagnosis present

## 2019-06-09 DIAGNOSIS — Z8673 Personal history of transient ischemic attack (TIA), and cerebral infarction without residual deficits: Secondary | ICD-10-CM

## 2019-06-09 DIAGNOSIS — I6521 Occlusion and stenosis of right carotid artery: Secondary | ICD-10-CM | POA: Diagnosis present

## 2019-06-09 HISTORY — PX: TRANSCAROTID ARTERY REVASCULARIZATIONÂ: SHX6778

## 2019-06-09 HISTORY — PX: ULTRASOUND GUIDANCE FOR VASCULAR ACCESS: SHX6516

## 2019-06-09 LAB — CREATININE, SERUM
Creatinine, Ser: 0.85 mg/dL (ref 0.44–1.00)
GFR calc Af Amer: >60 mL/min (ref >60)
GFR calc non Af Amer: >60 mL/min (ref >60)

## 2019-06-09 LAB — CBC
HCT: 35.2 % — ABNORMAL LOW (ref 36.0–46.0)
Hemoglobin: 11 g/dL — ABNORMAL LOW (ref 12.0–15.0)
MCH: 26.4 pg (ref 26.0–34.0)
MCHC: 31.3 g/dL (ref 30.0–36.0)
MCV: 84.4 fL (ref 80.0–100.0)
Platelets: 267 10*3/uL (ref 150–400)
RBC: 4.17 MIL/uL (ref 3.87–5.11)
RDW: 14.7 % (ref 11.5–15.5)
WBC: 5.4 10*3/uL (ref 4.0–10.5)
nRBC: 0 % (ref 0.0–0.2)

## 2019-06-09 LAB — POCT ACTIVATED CLOTTING TIME
Activated Clotting Time: 279 seconds
Activated Clotting Time: 136 seconds

## 2019-06-09 SURGERY — TRANSCAROTID ARTERY REVASCULARIZATION (TCAR)
Anesthesia: General | Site: Neck | Laterality: Right

## 2019-06-09 MED ORDER — PANTOPRAZOLE SODIUM 40 MG PO TBEC
40.0000 mg | DELAYED_RELEASE_TABLET | Freq: Every day | ORAL | Status: DC
  Administered 2019-06-10: 40 mg via ORAL
  Filled 2019-06-09: qty 1

## 2019-06-09 MED ORDER — FENTANYL CITRATE (PF) 250 MCG/5ML IJ SOLN
INTRAMUSCULAR | Status: AC
  Filled 2019-06-09: qty 5

## 2019-06-09 MED ORDER — ACETAMINOPHEN 325 MG PO TABS: 325.0000 mg | ORAL_TABLET | ORAL | Status: DC | PRN

## 2019-06-09 MED ORDER — FENTANYL CITRATE (PF) 100 MCG/2ML IJ SOLN
INTRAMUSCULAR | Status: DC | PRN
  Administered 2019-06-09 (×3): 50 ug via INTRAVENOUS

## 2019-06-09 MED ORDER — ROCURONIUM BROMIDE 10 MG/ML (PF) SYRINGE
PREFILLED_SYRINGE | INTRAVENOUS | Status: AC
  Filled 2019-06-09: qty 10

## 2019-06-09 MED ORDER — SODIUM CHLORIDE 0.9 % IV SOLN
INTRAVENOUS | Status: DC | PRN
  Administered 2019-06-09: 500 mL

## 2019-06-09 MED ORDER — ONDANSETRON HCL 4 MG/2ML IJ SOLN: 4.0000 mg | Freq: Once | INTRAMUSCULAR | Status: DC | PRN

## 2019-06-09 MED ORDER — SODIUM CHLORIDE 0.9 % IV SOLN: INTRAVENOUS | Status: DC

## 2019-06-09 MED ORDER — GLYCOPYRROLATE PF 0.2 MG/ML IJ SOSY
PREFILLED_SYRINGE | INTRAMUSCULAR | Status: AC
  Filled 2019-06-09: qty 1

## 2019-06-09 MED ORDER — ONDANSETRON HCL 4 MG/2ML IJ SOLN
INTRAMUSCULAR | Status: AC
  Filled 2019-06-09: qty 2

## 2019-06-09 MED ORDER — ROSUVASTATIN CALCIUM 5 MG PO TABS
10.0000 mg | ORAL_TABLET | Freq: Every day | ORAL | Status: DC
  Administered 2019-06-09 – 2019-06-10 (×2): 10 mg via ORAL
  Filled 2019-06-09 (×2): qty 2

## 2019-06-09 MED ORDER — ASPIRIN EC 81 MG PO TBEC
81.0000 mg | DELAYED_RELEASE_TABLET | Freq: Every day | ORAL | Status: DC
  Administered 2019-06-09: 81 mg via ORAL
  Filled 2019-06-09: qty 1

## 2019-06-09 MED ORDER — FENTANYL CITRATE (PF) 100 MCG/2ML IJ SOLN: 25.0000 ug | INTRAMUSCULAR | Status: DC | PRN

## 2019-06-09 MED ORDER — SENNOSIDES-DOCUSATE SODIUM 8.6-50 MG PO TABS: 1.0000 | ORAL_TABLET | Freq: Every evening | ORAL | Status: DC | PRN

## 2019-06-09 MED ORDER — PHENOL 1.4 % MT LIQD: 1.0000 | OROMUCOSAL | Status: DC | PRN

## 2019-06-09 MED ORDER — CHLORHEXIDINE GLUCONATE CLOTH 2 % EX PADS: 6.0000 | MEDICATED_PAD | Freq: Once | CUTANEOUS | Status: DC

## 2019-06-09 MED ORDER — CHLORHEXIDINE GLUCONATE 0.12 % MT SOLN: 15.0000 mL | Freq: Once | OROMUCOSAL | Status: DC

## 2019-06-09 MED ORDER — NITROGLYCERIN 0.2 MG/ML ON CALL CATH LAB
INTRAVENOUS | Status: DC | PRN
  Administered 2019-06-09 (×5): 20 ug via INTRAVENOUS

## 2019-06-09 MED ORDER — METOPROLOL TARTRATE 5 MG/5ML IV SOLN: 2.0000 mg | INTRAVENOUS | Status: DC | PRN

## 2019-06-09 MED ORDER — DEXAMETHASONE SODIUM PHOSPHATE 10 MG/ML IJ SOLN
INTRAMUSCULAR | Status: DC | PRN
Start: 2019-06-09 — End: 2019-06-09
  Administered 2019-06-09: 10 mg via INTRAVENOUS

## 2019-06-09 MED ORDER — SUGAMMADEX SODIUM 200 MG/2ML IV SOLN
INTRAVENOUS | Status: DC | PRN
  Administered 2019-06-09: 200 mg via INTRAVENOUS

## 2019-06-09 MED ORDER — CEFAZOLIN SODIUM-DEXTROSE 2-4 GM/100ML-% IV SOLN
2.0000 g | INTRAVENOUS | Status: AC
  Administered 2019-06-09: 2 g via INTRAVENOUS
  Filled 2019-06-09: qty 100

## 2019-06-09 MED ORDER — LIDOCAINE HCL (PF) 1 % IJ SOLN
INTRAMUSCULAR | Status: AC
  Filled 2019-06-09: qty 30

## 2019-06-09 MED ORDER — SODIUM CHLORIDE 0.9 % IV SOLN: 500.0000 mL | Freq: Once | INTRAVENOUS | Status: DC | PRN

## 2019-06-09 MED ORDER — PROTAMINE SULFATE 10 MG/ML IV SOLN
INTRAVENOUS | Status: DC | PRN
Start: 2019-06-09 — End: 2019-06-09
  Administered 2019-06-09: 10 mg via INTRAVENOUS
  Administered 2019-06-09 (×2): 20 mg via INTRAVENOUS

## 2019-06-09 MED ORDER — SODIUM CHLORIDE 0.9 % IV SOLN
INTRAVENOUS | Status: AC
  Filled 2019-06-09: qty 1.2

## 2019-06-09 MED ORDER — PHENYLEPHRINE HCL-NACL 10-0.9 MG/250ML-% IV SOLN
INTRAVENOUS | Status: DC | PRN
Start: 2019-06-09 — End: 2019-06-09
  Administered 2019-06-09: 25 ug/min via INTRAVENOUS

## 2019-06-09 MED ORDER — AMLODIPINE BESYLATE 5 MG PO TABS
5.0000 mg | ORAL_TABLET | Freq: Every day | ORAL | Status: DC
  Administered 2019-06-09: 5 mg via ORAL
  Filled 2019-06-09: qty 1

## 2019-06-09 MED ORDER — CLOPIDOGREL BISULFATE 75 MG PO TABS
75.0000 mg | ORAL_TABLET | Freq: Every day | ORAL | Status: DC
  Administered 2019-06-10: 75 mg via ORAL
  Filled 2019-06-09: qty 1

## 2019-06-09 MED ORDER — HEPARIN SODIUM (PORCINE) 1000 UNIT/ML IJ SOLN
INTRAMUSCULAR | Status: AC
  Filled 2019-06-09: qty 1

## 2019-06-09 MED ORDER — ACETAMINOPHEN 500 MG PO TABS: 500.0000 mg | ORAL_TABLET | Freq: Four times a day (QID) | ORAL | Status: DC | PRN

## 2019-06-09 MED ORDER — HYDRALAZINE HCL 20 MG/ML IJ SOLN: 5.0000 mg | INTRAMUSCULAR | Status: DC | PRN

## 2019-06-09 MED ORDER — LABETALOL HCL 5 MG/ML IV SOLN: 10.0000 mg | INTRAVENOUS | Status: DC | PRN

## 2019-06-09 MED ORDER — ROCURONIUM BROMIDE 10 MG/ML (PF) SYRINGE
PREFILLED_SYRINGE | INTRAVENOUS | Status: DC | PRN
  Administered 2019-06-09: 50 mg via INTRAVENOUS
  Administered 2019-06-09: 10 mg via INTRAVENOUS

## 2019-06-09 MED ORDER — MAGNESIUM SULFATE 2 GM/50ML IV SOLN: 2.0000 g | Freq: Every day | INTRAVENOUS | Status: DC | PRN

## 2019-06-09 MED ORDER — ALUM & MAG HYDROXIDE-SIMETH 200-200-20 MG/5ML PO SUSP: 15.0000 mL | ORAL | Status: DC | PRN

## 2019-06-09 MED ORDER — HEPARIN SODIUM (PORCINE) 1000 UNIT/ML IJ SOLN
INTRAMUSCULAR | Status: DC | PRN
Start: 2019-06-09 — End: 2019-06-09
  Administered 2019-06-09: 8000 [IU] via INTRAVENOUS

## 2019-06-09 MED ORDER — 0.9 % SODIUM CHLORIDE (POUR BTL) OPTIME
TOPICAL | Status: DC | PRN
  Administered 2019-06-09: 1000 mL

## 2019-06-09 MED ORDER — LEVOTHYROXINE SODIUM 112 MCG PO TABS: 112.0000 ug | ORAL_TABLET | Freq: Every day | ORAL | Status: DC

## 2019-06-09 MED ORDER — LIDOCAINE 2% (20 MG/ML) 5 ML SYRINGE
INTRAMUSCULAR | Status: AC
  Filled 2019-06-09: qty 5

## 2019-06-09 MED ORDER — LEVOTHYROXINE SODIUM 112 MCG PO TABS
112.0000 ug | ORAL_TABLET | Freq: Every day | ORAL | Status: DC
  Administered 2019-06-10: 112 ug via ORAL
  Filled 2019-06-09: qty 1

## 2019-06-09 MED ORDER — LACTATED RINGERS IV SOLN: INTRAVENOUS | Status: DC | PRN

## 2019-06-09 MED ORDER — IODIXANOL 320 MG/ML IV SOLN
INTRAVENOUS | Status: DC | PRN
  Administered 2019-06-09: 20 mL via INTRA_ARTERIAL

## 2019-06-09 MED ORDER — DEXAMETHASONE SODIUM PHOSPHATE 10 MG/ML IJ SOLN
INTRAMUSCULAR | Status: AC
  Filled 2019-06-09: qty 1

## 2019-06-09 MED ORDER — HEMOSTATIC AGENTS (NO CHARGE) OPTIME
TOPICAL | Status: DC | PRN
  Administered 2019-06-09: 1 via TOPICAL

## 2019-06-09 MED ORDER — CEFAZOLIN SODIUM-DEXTROSE 2-4 GM/100ML-% IV SOLN
2.0000 g | Freq: Three times a day (TID) | INTRAVENOUS | Status: AC
  Administered 2019-06-09 – 2019-06-10 (×2): 2 g via INTRAVENOUS
  Filled 2019-06-09 (×2): qty 100

## 2019-06-09 MED ORDER — HEPARIN SODIUM (PORCINE) 5000 UNIT/ML IJ SOLN: 5000.0000 [IU] | Freq: Three times a day (TID) | INTRAMUSCULAR | Status: DC

## 2019-06-09 MED ORDER — ONDANSETRON HCL 4 MG/2ML IJ SOLN
INTRAMUSCULAR | Status: DC | PRN
  Administered 2019-06-09: 4 mg via INTRAVENOUS

## 2019-06-09 MED ORDER — LIDOCAINE 2% (20 MG/ML) 5 ML SYRINGE
INTRAMUSCULAR | Status: DC | PRN
  Administered 2019-06-09: 40 mg via INTRAVENOUS

## 2019-06-09 MED ORDER — ACETAMINOPHEN 500 MG PO TABS
1000.0000 mg | ORAL_TABLET | Freq: Once | ORAL | Status: AC
  Administered 2019-06-09: 1000 mg via ORAL
  Filled 2019-06-09: qty 2

## 2019-06-09 MED ORDER — DOCUSATE SODIUM 100 MG PO CAPS
100.0000 mg | ORAL_CAPSULE | Freq: Every day | ORAL | Status: DC
Start: 2019-06-10 — End: 2019-06-09

## 2019-06-09 MED ORDER — POTASSIUM CHLORIDE CRYS ER 20 MEQ PO TBCR: 20.0000 meq | EXTENDED_RELEASE_TABLET | Freq: Every day | ORAL | Status: DC | PRN

## 2019-06-09 MED ORDER — BISACODYL 5 MG PO TBEC: 5.0000 mg | DELAYED_RELEASE_TABLET | Freq: Every day | ORAL | Status: DC | PRN

## 2019-06-09 MED ORDER — PROPOFOL 10 MG/ML IV BOLUS
INTRAVENOUS | Status: AC
  Filled 2019-06-09: qty 20

## 2019-06-09 MED ORDER — GUAIFENESIN-DM 100-10 MG/5ML PO SYRP: 15.0000 mL | ORAL_SOLUTION | ORAL | Status: DC | PRN

## 2019-06-09 MED ORDER — PROTAMINE SULFATE 10 MG/ML IV SOLN
INTRAVENOUS | Status: AC
  Filled 2019-06-09: qty 5

## 2019-06-09 MED ORDER — OXYCODONE HCL 5 MG PO TABS: 5.0000 mg | ORAL_TABLET | ORAL | Status: DC | PRN

## 2019-06-09 MED ORDER — ONDANSETRON HCL 4 MG/2ML IJ SOLN: 4.0000 mg | Freq: Four times a day (QID) | INTRAMUSCULAR | Status: DC | PRN

## 2019-06-09 MED ORDER — LACTATED RINGERS IV SOLN
INTRAVENOUS | Status: DC | PRN
Start: 2019-06-09 — End: 2019-06-09

## 2019-06-09 MED ORDER — ACETAMINOPHEN 650 MG RE SUPP: 325.0000 mg | RECTAL | Status: DC | PRN

## 2019-06-09 MED ORDER — DOCUSATE SODIUM 100 MG PO CAPS
100.0000 mg | ORAL_CAPSULE | Freq: Every day | ORAL | Status: DC
  Administered 2019-06-09: 100 mg via ORAL
  Filled 2019-06-09: qty 1

## 2019-06-09 MED ORDER — PROPOFOL 10 MG/ML IV BOLUS
INTRAVENOUS | Status: DC | PRN
  Administered 2019-06-09: 100 mg via INTRAVENOUS
  Administered 2019-06-09: 30 mg via INTRAVENOUS

## 2019-06-09 MED ORDER — GLYCOPYRROLATE PF 0.2 MG/ML IJ SOSY
PREFILLED_SYRINGE | INTRAMUSCULAR | Status: DC | PRN
  Administered 2019-06-09 (×2): .1 mg via INTRAVENOUS

## 2019-06-09 MED ORDER — HYDROMORPHONE HCL 1 MG/ML IJ SOLN: 0.5000 mg | INTRAMUSCULAR | Status: DC | PRN

## 2019-06-09 MED ORDER — PHENYLEPHRINE 40 MCG/ML (10ML) SYRINGE FOR IV PUSH (FOR BLOOD PRESSURE SUPPORT)
PREFILLED_SYRINGE | INTRAVENOUS | Status: DC | PRN
  Administered 2019-06-09: 80 ug via INTRAVENOUS

## 2019-06-09 SURGICAL SUPPLY — 76 items
ADH SKN CLS APL DERMABOND .7 (GAUZE/BANDAGES/DRESSINGS) ×4
ADPR TBG 2 MALE LL ART (MISCELLANEOUS)
BAG BANDED W/RUBBER/TAPE 36X54 (MISCELLANEOUS) ×3 IMPLANT
BAG EQP BAND 135X91 W/RBR TAPE (MISCELLANEOUS) ×2
BALLN STERLING RX 5X30X80 (BALLOONS) ×3
BALLOON STERLING RX 5X30X80 (BALLOONS) IMPLANT
CANISTER SUCT 3000ML PPV (MISCELLANEOUS) ×3 IMPLANT
CATH ROBINSON RED A/P 18FR (CATHETERS) IMPLANT
CLIP VESOCCLUDE MED 6/CT (CLIP) ×3 IMPLANT
CLIP VESOCCLUDE SM WIDE 6/CT (CLIP) ×3 IMPLANT
COVER DOME SNAP 22 D (MISCELLANEOUS) ×3 IMPLANT
COVER PROBE W GEL 5X96 (DRAPES) ×3 IMPLANT
COVER WAND RF STERILE (DRAPES) ×3 IMPLANT
DERMABOND ADVANCED (GAUZE/BANDAGES/DRESSINGS) ×2
DERMABOND ADVANCED .7 DNX12 (GAUZE/BANDAGES/DRESSINGS) ×2 IMPLANT
DRAIN CHANNEL 15F RND FF W/TCR (WOUND CARE) IMPLANT
DRAPE FEMORAL ANGIO 80X135IN (DRAPES) ×1 IMPLANT
DRAPE INCISE IOBAN 66X45 STRL (DRAPES) ×5 IMPLANT
ELECT REM PT RETURN 9FT ADLT (ELECTROSURGICAL) ×3
ELECTRODE REM PT RTRN 9FT ADLT (ELECTROSURGICAL) ×2 IMPLANT
EVACUATOR SILICONE 100CC (DRAIN) IMPLANT
GLOVE BIO SURGEON STRL SZ7.5 (GLOVE) ×3 IMPLANT
GOWN STRL REUS W/ TWL LRG LVL3 (GOWN DISPOSABLE) ×4 IMPLANT
GOWN STRL REUS W/ TWL XL LVL3 (GOWN DISPOSABLE) ×2 IMPLANT
GOWN STRL REUS W/TWL LRG LVL3 (GOWN DISPOSABLE) ×6
GOWN STRL REUS W/TWL XL LVL3 (GOWN DISPOSABLE) ×3
GUIDEWIRE ENROUTE 0.014 (WIRE) ×1 IMPLANT
HEMOSTAT SNOW SURGICEL 2X4 (HEMOSTASIS) ×1 IMPLANT
INSERT FOGARTY SM (MISCELLANEOUS) IMPLANT
INTRODUCER KIT GALT 7CM (INTRODUCER) ×3
IV ADAPTER SYR DOUBLE MALE LL (MISCELLANEOUS) IMPLANT
KIT BASIN OR (CUSTOM PROCEDURE TRAY) ×3 IMPLANT
KIT ENCORE 26 ADVANTAGE (KITS) ×3 IMPLANT
KIT INTRODUCER GALT 7 (INTRODUCER) ×2 IMPLANT
KIT TURNOVER KIT B (KITS) ×3 IMPLANT
NDL HYPO 25GX1X1/2 BEV (NEEDLE) IMPLANT
NDL PERC 18GX7CM (NEEDLE) ×2 IMPLANT
NDL SPNL 20GX3.5 QUINCKE YW (NEEDLE) IMPLANT
NEEDLE HYPO 25GX1X1/2 BEV (NEEDLE) IMPLANT
NEEDLE PERC 18GX7CM (NEEDLE) ×3 IMPLANT
NEEDLE SPNL 20GX3.5 QUINCKE YW (NEEDLE) IMPLANT
PACK CAROTID (CUSTOM PROCEDURE TRAY) ×3 IMPLANT
PACK UNIVERSAL I (CUSTOM PROCEDURE TRAY) ×2 IMPLANT
PAD ARMBOARD 7.5X6 YLW CONV (MISCELLANEOUS) ×6 IMPLANT
POSITIONER HEAD DONUT 9IN (MISCELLANEOUS) ×3 IMPLANT
PROTECTION STATION PRESSURIZED (MISCELLANEOUS)
SET MICROPUNCTURE 5F STIFF (MISCELLANEOUS) ×2 IMPLANT
SHEATH AVANTI 11CM 5FR (SHEATH) IMPLANT
SHUNT CAROTID BYPASS 10 (VASCULAR PRODUCTS) IMPLANT
SHUNT CAROTID BYPASS 12FRX15.5 (VASCULAR PRODUCTS) IMPLANT
STATION PROTECTION PRESSURIZED (MISCELLANEOUS) ×2 IMPLANT
STENT TRANSCAROTID SYS 7X40 (Permanent Stent) ×1 IMPLANT
STOPCOCK 4 WAY LG BORE MALE ST (IV SETS) ×3 IMPLANT
SUT ETHILON 3 0 PS 1 (SUTURE) IMPLANT
SUT MNCRL AB 4-0 PS2 18 (SUTURE) ×3 IMPLANT
SUT PROLENE 5 0 C 1 24 (SUTURE) ×3 IMPLANT
SUT PROLENE 6 0 BV (SUTURE) ×1 IMPLANT
SUT PROLENE 7 0 BV 1 (SUTURE) IMPLANT
SUT SILK 2 0 PERMA HAND 18 BK (SUTURE) ×2 IMPLANT
SUT SILK 2 0 SH CR/8 (SUTURE) ×2 IMPLANT
SUT SILK 3 0 (SUTURE)
SUT SILK 3-0 18XBRD TIE 12 (SUTURE) IMPLANT
SUT VIC AB 3-0 SH 27 (SUTURE) ×3
SUT VIC AB 3-0 SH 27X BRD (SUTURE) ×2 IMPLANT
SYR 10ML LL (SYRINGE) ×9 IMPLANT
SYR 20ML LL LF (SYRINGE) ×3 IMPLANT
SYR 5ML LL (SYRINGE) ×3 IMPLANT
SYR CONTROL 10ML LL (SYRINGE) IMPLANT
SYSTEM TRANSCAROTID NEUROPRTCT (MISCELLANEOUS) IMPLANT
TOWEL GREEN STERILE (TOWEL DISPOSABLE) ×3 IMPLANT
TRANSCAROTID NEUROPROTECT SYS (MISCELLANEOUS) ×3
TUBING ART PRESS 48 MALE/FEM (TUBING) IMPLANT
TUBING EXTENTION W/L.L. (IV SETS) IMPLANT
WATER STERILE IRR 1000ML POUR (IV SOLUTION) ×3 IMPLANT
WIRE AMPLATZ SS-J .035X180CM (WIRE) IMPLANT
WIRE BENTSON .035X145CM (WIRE) ×3 IMPLANT

## 2019-06-09 NOTE — Anesthesia Procedure Notes (Signed)
Procedure Name: Intubation Date/Time: 06/09/2019 8:34 AM Performed by: Candis Shine, CRNA Pre-anesthesia Checklist: Patient identified, Emergency Drugs available, Suction available and Patient being monitored Patient Re-evaluated:Patient Re-evaluated prior to induction Oxygen Delivery Method: Circle System Utilized Preoxygenation: Pre-oxygenation with 100% oxygen Induction Type: IV induction Ventilation: Mask ventilation without difficulty Laryngoscope Size: Mac and 3 Grade View: Grade I Tube type: Oral Tube size: 7.0 mm Number of attempts: 1 Airway Equipment and Method: Stylet Placement Confirmation: ETT inserted through vocal cords under direct vision,  positive ETCO2 and breath sounds checked- equal and bilateral Secured at: 22 cm Tube secured with: Tape Dental Injury: Teeth and Oropharynx as per pre-operative assessment

## 2019-06-09 NOTE — Anesthesia Procedure Notes (Signed)
Arterial Line Insertion Start/End6/02/2019 8:00 AM Performed by: Jed Limerick, CRNA, CRNA  Patient location: Pre-op. Preanesthetic checklist: patient identified, IV checked, site marked, risks and benefits discussed, surgical consent, monitors and equipment checked, pre-op evaluation, timeout performed and anesthesia consent Lidocaine 1% used for infiltration Right, radial was placed Catheter size: 20 G Hand hygiene performed  and maximum sterile barriers used   Attempts: 1 Procedure performed without using ultrasound guided technique. Following insertion, dressing applied and Biopatch. Post procedure assessment: normal and unchanged  Patient tolerated the procedure well with no immediate complications.

## 2019-06-09 NOTE — Progress Notes (Signed)
  Day of Surgery Note    Subjective:  More alert now on progressive care unit.  Would like to sit up. Family at bedside   Vitals:   06/09/19 1045 06/09/19 1058  BP: 109/63   Pulse: 62   Resp: 12   Temp:  98.1 F (36.7 C)  SpO2: 100%    Arterial line pressure; sys 170s Neuro: intact. Tongue midline. Face symmetrical Incisions:   Right neck incision covered with dry gauze. No edema. Extremities:  Moves all extremities well. 5/5 grip strength bil Cardiac:  RRR Lungs:  nonlabored Abdomen:  Soft. Left groin soft without oozing   Assessment/Plan:  This is a 84 y.o. female who is s/p right TCAR.  Stable approximately 2 hours post-op   Wendi Maya, PA-C 06/09/2019 12:21 PM [phone]

## 2019-06-09 NOTE — Op Note (Signed)
    Patient name: Sarah Nelson MRN: [id number] DOB: 08-08-1935 Sex: female  06/09/2019 Pre-operative Diagnosis: Asymptomatic right carotid stenosis Post-operative diagnosis:  Same Surgeon:  Apolinar Junes C. Randie Heinz, MD Assistant: Clinton Gallant, PA Procedure Performed: Right transcarotid artery stenting with 7 x 40 mm in route stent with flow reversal neuro protection.  Indications: 84 year old female with history of left MCA stroke.  During the work-up she was found to have right ICA stenosis.  We will follow this for sometimes now greater than 80% and indicated for intervention.  Findings: There is approximately 90% stenosis at the distal common carotid artery.  After stenting this is reduced to less than 20%.   Procedure:  The patient was identified in the holding area and taken to the operating room where she is placed supine on the operating table and general anesthesia was induced.  She was sterilely prepped and draped in the neck and groins in the bilateral fashion and timeout was called.  We began using ultrasound to identify the common femoral vein on the left this was cannulated with 18-gauge needle and a Bentson wire was passed.  The [place] sheath was placed flushed with heparinized saline sutured to the skin with 2-0 silk suture.  We turned our attention to the neck.  Longitudinal incision was made where we identified the common carotid artery pulse.  We dissected down between the 2 heads of the sternocleidomastoid.  The IJ was reflected laterally.  The vagus was identified protected.  We placed a vessel loop and umbilical tape around the common carotid artery.  Patient was fully heparinized.  We placed a 5-0 Prolene suture in figure-of-eight fashion and the common carotid artery.  We then cannulated the common carotid artery with micropuncture needle and passed the wire to 3 cm.  The sheath was placed at 3 cm.  Angiogram was performed.  We then placed the sheath and initiated reversal flow and  confirmed passive reversal flow in the groin.  ACT returned 270 blood pressure was greater than 140 systolic.  Preoperatively we had confirmed aspirin Plavix usage.  Common carotid artery was clamped.  We crossed the lesion with wire balloon dilated with this with 5 x 40 mm balloon.  We primarily stented with 7 x 40 stent.  We waited 2 minutes for washout.  We confirmed angiography with 2 views.  Stent had less than 20% stenosis.  Wire was removed and the clamp was removed.  We removed the reversal flow.  We cinched down the 5-0 Prolene suture.  50 mg of protamine was administered after we confirmed flow with Doppler.  Sheath was removed and the left groin we held pressure.  We irrigated the right neck wound closed in layers of Vicryl Monocryl.  Patient was awakened anesthesia having tolerated procedure without any complication.  All counts were correct at completion.   EBL: 50cc  Malaijah Houchen C. Randie Heinz, MD [facility] [place] [place] [facility] Office: [phone] Pager: [phone]

## 2019-06-09 NOTE — Anesthesia Postprocedure Evaluation (Signed)
Anesthesia Post Note  Patient: Sarah Nelson  Procedure(s) Performed: TRANSCAROTID ARTERY REVASCULARIZATION (Right Neck) Ultrasound Guidance For Vascular Access (Left Groin)     Patient location during evaluation: PACU Anesthesia Type: General Level of consciousness: awake and alert, oriented and patient cooperative Pain management: pain level controlled Vital Signs Assessment: post-procedure vital signs reviewed and stable Respiratory status: spontaneous breathing, nonlabored ventilation and respiratory function stable Cardiovascular status: blood pressure returned to baseline and stable Postop Assessment: no apparent nausea or vomiting Anesthetic complications: no    Last Vitals:  Vitals:   06/09/19 1015 06/09/19 1030  BP: (!) 115/58 (!) 119/57  Pulse: 64 60  Resp: 12 11  Temp: 36.6 C   SpO2: 100% 100%    Last Pain:  Vitals:   06/09/19 1030  PainSc: 0-No pain                 Tennis Must Louvina Cleary

## 2019-06-09 NOTE — Anesthesia Preprocedure Evaluation (Addendum)
Anesthesia Evaluation  Patient identified by MRN, date of birth, ID band Patient awake    Reviewed: Allergy & Precautions, NPO status , Patient's Chart, lab work & pertinent test results  Airway Mallampati: I  TM Distance: >3 FB Neck ROM: Full    Dental no notable dental hx. (+) Teeth Intact, Dental Advisory Given   Pulmonary  COVID + in Feb 2021, never really symptomatic   Pulmonary exam normal breath sounds clear to auscultation       Cardiovascular hypertension, Pt. on medications +CHF  Normal cardiovascular exam Rhythm:Regular Rate:Normal  Last echo 05/2017: - Left ventricle: The cavity size was normal. Wall thickness was  increased in a pattern of mild LVH. Systolic function was  vigorous. The estimated ejection fraction was in the range of 65%  to 70%. Wall motion was normal; there were no regional wall  motion abnormalities. Features are consistent with a pseudonormal  left ventricular filling pattern, with concomitant abnormal  relaxation and increased filling pressure (grade 2 diastolic  dysfunction). Indeterminate filling pressures.  - Aortic valve: Trileaflet; mildly thickened, mildly calcified  leaflets. There was no stenosis.  - Left atrium: The atrium was mildly dilated.  - Atrial septum: There was increased thickness of the septum,  consistent with lipomatous hypertrophy. No defect or patent  foramen ovale was identified.    Neuro/Psych high-grade right ICA stenosis that is asymptomatic  On plavix/ASA  CVA 2 years ago, some balance issues but had this prior to CVA  CVA, No Residual Symptoms negative psych ROS   GI/Hepatic negative GI ROS, Neg liver ROS,   Endo/Other  Hypothyroidism   Renal/GU negative Renal ROS  negative genitourinary   Musculoskeletal  (+) Arthritis , Osteoarthritis,    Abdominal Normal abdominal exam  (+)   Peds negative pediatric ROS (+)   Hematology negative hematology ROS (+)   Anesthesia Other Findings   Reproductive/Obstetrics negative OB ROS                            Anesthesia Physical Anesthesia Plan  ASA: III  Anesthesia Plan: General   Post-op Pain Management:    Induction: Intravenous  PONV Risk Score and Plan: 3 and Ondansetron, Dexamethasone and Treatment may vary due to age or medical condition  Airway Management Planned: Oral ETT  Additional Equipment: Arterial line  Intra-op Plan:   Post-operative Plan: Extubation in OR  Informed Consent: I have reviewed the patients History and Physical, chart, labs and discussed the procedure including the risks, benefits and alternatives for the proposed anesthesia with the patient or authorized representative who has indicated his/her understanding and acceptance.     Dental advisory given  Plan Discussed with: CRNA  Anesthesia Plan Comments:        Anesthesia Quick Evaluation

## 2019-06-09 NOTE — Discharge Instructions (Signed)
   [facility]  Discharge Instructions   Carotid Endarterectomy (CEA)  Please refer to the following instructions for your post-procedure care. Your surgeon or physician assistant will discuss any changes with you.  Activity  You are encouraged to walk as much as you can. You can slowly return to normal activities but must avoid strenuous activity and heavy lifting until your doctor tell you it's OK. Avoid activities such as vacuuming or swinging a golf club. You can drive after one week if you are comfortable and you are no longer taking prescription pain medications. It is normal to feel tired for serval weeks after your surgery. It is also normal to have difficulty with sleep habits, eating, and bowel movements after surgery. These will go away with time.  Bathing/Showering  You may shower after you come home. Do not soak in a bathtub, hot tub, or swim until the incision heals completely.  Incision Care  Shower every day. Clean your incision with mild soap and water. Pat the area dry with a clean towel. You do not need a bandage unless otherwise instructed. Do not apply any ointments or creams to your incision. You may have skin glue on your incision. Do not peel it off. It will come off on its own in about one week. Your incision may feel thickened and raised for several weeks after your surgery. This is normal and the skin will soften over time. For Men Only: It's OK to shave around the incision but do not shave the incision itself for 2 weeks. It is common to have numbness under your chin that could last for several months.  Diet  Resume your normal diet. There are no special food restrictions following this procedure. A low fat/low cholesterol diet is recommended for all patients with vascular disease. In order to heal from your surgery, it is CRITICAL to get adequate nutrition. Your body requires vitamins, minerals, and protein. Vegetables are the best  source of vitamins and minerals. Vegetables also provide the perfect balance of protein. Processed food has little nutritional value, so try to avoid this.        Medications  Resume taking all of your medications unless your doctor or physician assistant tells you not to. If your incision is causing pain, you may take over-the- counter pain relievers such as acetaminophen (Tylenol). If you were prescribed a stronger pain medication, please be aware these medications can cause nausea and constipation. Prevent nausea by taking the medication with a snack or meal. Avoid constipation by drinking plenty of fluids and eating foods with a high amount of fiber, such as fruits, vegetables, and grains. Do not take Tylenol if you are taking prescription pain medications.  Follow Up  Our office will schedule a follow up appointment 2-3 weeks following discharge.  Please call us immediately for any of the following conditions  Increased pain, redness, drainage (pus) from your incision site. Fever of 101 degrees or higher. If you should develop stroke (slurred speech, difficulty swallowing, weakness on one side of your body, loss of vision) you should call 911 and go to the nearest emergency room.  Reduce your risk of vascular disease:  Stop smoking. If you would like help call [facility] at 1-800-QUIT-NOW (1-800-784-8669) or [place] at 336-586-4000. Manage your cholesterol Maintain a desired weight Control your diabetes Keep your blood pressure down  If you have any questions, please call the office at [phone].   

## 2019-06-09 NOTE — Transfer of Care (Signed)
Immediate Anesthesia Transfer of Care Note  Patient: Sarah Nelson  Procedure(s) Performed: TRANSCAROTID ARTERY REVASCULARIZATION (Right Neck) Ultrasound Guidance For Vascular Access (Left Groin)  Patient Location: PACU  Anesthesia Type:General  Level of Consciousness: drowsy  Airway & Oxygen Therapy: Patient Spontanous Breathing and Patient connected to face mask oxygen  Post-op Assessment: Report given to RN, Post -op Vital signs reviewed and stable and Patient moving all extremities X 4  Post vital signs: Reviewed and stable  Last Vitals:  Vitals Value Taken Time  BP 115/58 06/09/19 1013  Temp    Pulse 66 06/09/19 1013  Resp 14 06/09/19 1013  SpO2 100 % 06/09/19 1013  Vitals shown include unvalidated device data.  Last Pain:  Vitals:   06/09/19 0712  PainSc: 0-No pain         Complications: No apparent anesthesia complications

## 2019-06-09 NOTE — H&P (Signed)
   History and Physical Update  The patient was interviewed and re-examined.  The patient's previous History and Physical has been reviewed and is unchanged from recent office visit. Plan for Right TCAR today in OR. She has been taking asa and plavix.   Yunior Jain C. Randie Heinz, MD [facility] Office: [phone] Pager: [phone]   06/09/2019, 8:13 AM

## 2019-06-09 NOTE — Progress Notes (Signed)
Pt arrived from unit from PACU. VSS. Mid/ Right neck incision scant bleeding with gauze dressing.  Groin incision level 0. Will continue to monitor. Pt. Oriented to unit    Everlean Cherry, RN

## 2019-06-10 ENCOUNTER — Encounter: Payer: Self-pay | Admitting: *Deleted

## 2019-06-10 LAB — BASIC METABOLIC PANEL
Anion gap: 8 (ref 5–15)
BUN: 11 mg/dL (ref 8–23)
CO2: 22 mmol/L (ref 22–32)
Calcium: 8.5 mg/dL — ABNORMAL LOW (ref 8.9–10.3)
Chloride: 109 mmol/L (ref 98–111)
Creatinine, Ser: 1.02 mg/dL — ABNORMAL HIGH (ref 0.44–1.00)
GFR calc Af Amer: 60 mL/min — ABNORMAL LOW (ref >60)
GFR calc non Af Amer: 52 mL/min — ABNORMAL LOW (ref >60)
Glucose, Bld: 115 mg/dL — ABNORMAL HIGH (ref 70–99)
Potassium: 3.9 mmol/L (ref 3.5–5.1)
Sodium: 139 mmol/L (ref 135–145)

## 2019-06-10 LAB — CBC
HCT: 31.6 % — ABNORMAL LOW (ref 36.0–46.0)
Hemoglobin: 9.9 g/dL — ABNORMAL LOW (ref 12.0–15.0)
MCH: 26.1 pg (ref 26.0–34.0)
MCHC: 31.3 g/dL (ref 30.0–36.0)
MCV: 83.2 fL (ref 80.0–100.0)
Platelets: 280 10*3/uL (ref 150–400)
RBC: 3.8 MIL/uL — ABNORMAL LOW (ref 3.87–5.11)
RDW: 14.7 % (ref 11.5–15.5)
WBC: 7.5 10*3/uL (ref 4.0–10.5)
nRBC: 0 % (ref 0.0–0.2)

## 2019-06-10 NOTE — Progress Notes (Addendum)
[facility]  Subjective  - Doing well over all   Objective (!) 132/53 78 98.5 F (36.9 C) (Oral) 17 97%  Intake/Output Summary (Last 24 hours) at 06/10/2019 0715 Last data filed at 06/10/2019 0300 Gross per 24 hour  Intake 1100 ml  Output 370 ml  Net 730 ml    Right common carotid incision healing well No tongue deviation and smile is symmetric Moving all 4 ext. Left groin soft healing well  Assessment/Planning: POD # 1 TCAR  Stable for discharge after hall ambulation She does not want narcotic pain medication for home, she will take tylenol. F/U in 2-3 weeks for f/u duplex and to see Dr. Ricki Miller 06/10/2019 7:15 AM --  Laboratory Lab Results: Recent Labs    06/09/19 1100 06/10/19 0458  WBC 5.4 7.5  HGB 11.0* 9.9*  HCT 35.2* 31.6*  PLT 267 280   BMET Recent Labs    06/09/19 1100 06/10/19 0458  NA  --  139  K  --  3.9  CL  --  109  CO2  --  22  GLUCOSE  --  115*  BUN  --  11  CREATININE 0.85 1.02*  CALCIUM  --  8.5*    COAG Lab Results  Component Value Date   INR 1.0 06/02/2019   No results found for: PTT  I have independently interviewed and examined patient and agree with PA assessment and plan above. Mild right neck swelling. Ok for [employer].   Areebah Meinders C. Randie Heinz, MD [facility] Office: [phone] Pager: [phone]

## 2019-06-10 NOTE — Progress Notes (Signed)
Pt discharged from unit. Medication/discharge instruction given, [facility]  Avigail Pilling K Bitania Shankland, RN   

## 2019-06-15 NOTE — Discharge Summary (Signed)
[facility] Discharge Summary   Patient ID:  Sarah Nelson MRN: [id number] DOB/AGE: 11-Jun-1935 57 y.o.  Admit date: 06/09/2019 Discharge date: 06/10/2019 Date of Surgery: 06/09/2019 Surgeon: Surgeon(s): Maeola Harman, MD  Admission Diagnosis: Carotid stenosis [I65.29]  Discharge Diagnoses:  Carotid stenosis [I65.29]  Secondary Diagnoses: Past Medical History:  Diagnosis Date  . Arthritis   . Carotid artery occlusion   . COVID-19 02/09/2019  . High cholesterol   . Hypertension   . Stroke (HCC) 05/2017  . Thyroid disease     Procedure(s): TRANSCAROTID ARTERY REVASCULARIZATION Ultrasound Guidance For Vascular Access  Discharged Condition: stable  HPI: Sarah Nelson is a 84 y.o. female has a history of a left MCA stroke.  We have followed her carotid artery stenosis she now has high-grade stenosis on the right has bilateral bruits noted from previous visit.   Plan for Right TCAR  Carotid duplex shows 80-99% stenosis.  CTA 80% stenosis.    Hospital Course:  Sarah Nelson is a 84 y.o. female is S/P Right Procedure(s): TRANSCAROTID ARTERY REVASCULARIZATION Ultrasound Guidance For Vascular Access No neurologic deficits, moving all 4 ext. Speech clear and swallowing well.   Stable for discharge.  Continue Plavix daily.  Significant Diagnostic Studies: CBC Lab Results  Component Value Date   WBC 7.5 06/10/2019   HGB 9.9 (L) 06/10/2019   HCT 31.6 (L) 06/10/2019   MCV 83.2 06/10/2019   PLT 280 06/10/2019    BMET    Component Value Date/Time   NA 139 06/10/2019 0458   K 3.9 06/10/2019 0458   CL 109 06/10/2019 0458   CO2 22 06/10/2019 0458   GLUCOSE 115 (H) 06/10/2019 0458   BUN 11 06/10/2019 0458   CREATININE 1.02 (H) 06/10/2019 0458   CALCIUM 8.5 (L) 06/10/2019 0458   GFRNONAA 52 (L) 06/10/2019 0458   GFRAA 60 (L) 06/10/2019 0458   COAG Lab Results  Component Value Date   INR 1.0 06/02/2019     Disposition:   Discharge to :Home Discharge Instructions    Call MD for:  redness, tenderness, or signs of infection (pain, swelling, bleeding, redness, odor or green/yellow discharge around incision site)   Complete by: As directed    Call MD for:  severe or increased pain, loss or decreased feeling  in affected limb(s)   Complete by: As directed    Call MD for:  temperature >100.5   Complete by: As directed    Resume previous diet   Complete by: As directed      Allergies as of 06/10/2019   No Known Allergies     Medication List    TAKE these medications   acetaminophen 500 MG tablet Commonly known as: TYLENOL Take 500 mg by mouth every 6 (six) hours as needed (pain.).   amLODipine 5 MG tablet Commonly known as: NORVASC Take 5 mg by mouth at bedtime.   aspirin EC 81 MG tablet Take 81 mg by mouth at bedtime.   clopidogrel 75 MG tablet Commonly known as: PLAVIX Take 1 tablet (75 mg total) by mouth daily.   docusate sodium 100 MG capsule Commonly known as: COLACE Take 100 mg by mouth at bedtime.   levothyroxine 112 MCG tablet Commonly known as: SYNTHROID Take 112 mcg by mouth daily before breakfast.   rosuvastatin 10 MG tablet Commonly known as: CRESTOR Take 10 mg by mouth daily.      Verbal and written Discharge instructions given to the patient. Wound care per  Discharge AVS Follow-up Information    Waynetta Sandy, MD In 2 weeks.   Specialties: Vascular Surgery, Cardiology Why: Office will call you to arrange your appt (sent) Contact information: [address] [place] [address] [phone]           Signed: Roxy Horseman 06/15/2019, 7:46 AM  --- For VQI Registry use --- Instructions: Press F2 to tab through selections.  Delete question if not applicable.   Modified Rankin score at D/C (0-6): Rankin Score=0  IV medication needed for:  1. Hypertension: No 2. Hypotension: No  Post-op Complications: No  1. Post-op CVA or TIA: No  If yes:  Event classification (right eye, left eye, right cortical, left cortical, verterobasilar, other):   If yes: Timing of event (intra-op, <6 hrs post-op, >=6 hrs post-op, unknown):   2. CN injury: No  If yes: CN 0 injuried   3. Myocardial infarction: No  If yes: Dx by (EKG or clinical, Troponin):   4.  CHF: No  5.  Dysrhythmia (new): No  6. Wound infection: No  7. Reperfusion symptoms: No  8. Return to OR: Yes  If yes: return to OR for (bleeding, neurologic, other CEA incision, other):   Discharge medications: Statin use:  Yes ASA use:  No  for medical reason   Beta blocker use:  No  for medical reason   ACE-Inhibitor use:  No  for medical reason   P2Y12 Antagonist use: [ ]  None, [x ] Plavix, [ ]  Plasugrel, [ ]  Ticlopinine, [ ]  Ticagrelor, [ ]  Other, [ ]  No for medical reason, [ ]  Non-compliant, [ ]  Not-indicated Anti-coagulant use:  [x ] None, [ ]  Warfarin, [ ]  Rivaroxaban, [ ]  Dabigatran, [ ]  Other, [ ]  No for medical reason, [ ]  Non-compliant, [ ]  Not-indicated

## 2019-06-24 ENCOUNTER — Other Ambulatory Visit: Payer: Self-pay | Admitting: *Deleted

## 2019-06-24 DIAGNOSIS — I6523 Occlusion and stenosis of bilateral carotid arteries: Secondary | ICD-10-CM

## 2019-07-02 ENCOUNTER — Ambulatory Visit (HOSPITAL_COMMUNITY)
Admission: RE | Admit: 2019-07-02 | Discharge: 2019-07-02 | Disposition: A | Discharge status: Home / Self Care | Payer: Medicare HMO | Source: Ambulatory Visit | Attending: Vascular Surgery | Admitting: Vascular Surgery

## 2019-07-02 ENCOUNTER — Ambulatory Visit (INDEPENDENT_AMBULATORY_CARE_PROVIDER_SITE_OTHER): Payer: Self-pay | Admitting: Vascular Surgery

## 2019-07-02 ENCOUNTER — Encounter: Payer: Self-pay | Admitting: Vascular Surgery

## 2019-07-02 ENCOUNTER — Other Ambulatory Visit: Payer: Self-pay

## 2019-07-02 VITALS — BP 149/74 | HR 71 | Resp 20 | Ht 66.5 in | Wt 154.0 lb

## 2019-07-02 DIAGNOSIS — I6523 Occlusion and stenosis of bilateral carotid arteries: Secondary | ICD-10-CM | POA: Insufficient documentation

## 2019-07-02 NOTE — Progress Notes (Signed)
    Subjective:     Patient ID: Sarah Nelson, female   DOB: 03-Dec-1935, 84 y.o.   MRN: [id number]  HPI 84 year old female status post transcarotid artery stent for high-grade asymptomatic disease.  From a stent standpoint she has done very well.   Review of Systems No complaints today related to the visit    Objective:   Physical Exam   Vitals:   07/02/19 1530 07/02/19 1532  BP: (!) 158/79 (!) 149/74  Pulse: 71   Resp: 20   SpO2: 99%    Awake alert oriented On lab respirations Neurologically intact Right neck incision clean dry intact healing well  I have independently interpreted her carotid duplex which demonstrates no residual stenosis status post transcarotid artery stent.  Left side demonstrates 40 to 59% stenosis     Assessment/plan   84 year old female status post right-sided transplant artery stenting.  We reviewed her imaging today including her angiography and her duplex.  We will follow her up in 9 months.  Raegan Sipp C. Randie Heinz, MD [facility] [place] Office: [phone] Pager: [phone]

## 2019-07-05 ENCOUNTER — Other Ambulatory Visit: Payer: Self-pay | Admitting: *Deleted

## 2019-07-05 DIAGNOSIS — I6523 Occlusion and stenosis of bilateral carotid arteries: Secondary | ICD-10-CM

## 2020-01-27 DIAGNOSIS — E039 Hypothyroidism, unspecified: Secondary | ICD-10-CM | POA: Diagnosis not present

## 2020-01-27 DIAGNOSIS — R21 Rash and other nonspecific skin eruption: Secondary | ICD-10-CM | POA: Diagnosis not present

## 2020-01-27 DIAGNOSIS — E785 Hyperlipidemia, unspecified: Secondary | ICD-10-CM | POA: Diagnosis not present

## 2020-01-27 DIAGNOSIS — K5900 Constipation, unspecified: Secondary | ICD-10-CM | POA: Diagnosis not present

## 2020-01-27 DIAGNOSIS — R7303 Prediabetes: Secondary | ICD-10-CM | POA: Diagnosis not present

## 2020-01-27 DIAGNOSIS — R269 Unspecified abnormalities of gait and mobility: Secondary | ICD-10-CM | POA: Diagnosis not present

## 2020-01-27 DIAGNOSIS — Z712 Person consulting for explanation of examination or test findings: Secondary | ICD-10-CM | POA: Diagnosis not present

## 2020-01-27 DIAGNOSIS — L2389 Allergic contact dermatitis due to other agents: Secondary | ICD-10-CM | POA: Diagnosis not present

## 2020-01-27 DIAGNOSIS — I679 Cerebrovascular disease, unspecified: Secondary | ICD-10-CM | POA: Diagnosis not present

## 2020-01-27 DIAGNOSIS — E079 Disorder of thyroid, unspecified: Secondary | ICD-10-CM | POA: Diagnosis not present

## 2020-01-28 ENCOUNTER — Other Ambulatory Visit: Payer: Self-pay | Admitting: Vascular Surgery

## 2020-01-31 DIAGNOSIS — R269 Unspecified abnormalities of gait and mobility: Secondary | ICD-10-CM | POA: Diagnosis not present

## 2020-01-31 DIAGNOSIS — E785 Hyperlipidemia, unspecified: Secondary | ICD-10-CM | POA: Diagnosis not present

## 2020-01-31 DIAGNOSIS — E079 Disorder of thyroid, unspecified: Secondary | ICD-10-CM | POA: Diagnosis not present

## 2020-01-31 DIAGNOSIS — R944 Abnormal results of kidney function studies: Secondary | ICD-10-CM | POA: Diagnosis not present

## 2020-01-31 DIAGNOSIS — R7303 Prediabetes: Secondary | ICD-10-CM | POA: Diagnosis not present

## 2020-01-31 DIAGNOSIS — E039 Hypothyroidism, unspecified: Secondary | ICD-10-CM | POA: Diagnosis not present

## 2020-01-31 DIAGNOSIS — K5900 Constipation, unspecified: Secondary | ICD-10-CM | POA: Diagnosis not present

## 2020-01-31 DIAGNOSIS — I679 Cerebrovascular disease, unspecified: Secondary | ICD-10-CM | POA: Diagnosis not present

## 2020-03-31 ENCOUNTER — Ambulatory Visit: Payer: Medicare HMO | Admitting: Vascular Surgery

## 2020-03-31 ENCOUNTER — Encounter: Payer: Self-pay | Admitting: Vascular Surgery

## 2020-03-31 ENCOUNTER — Ambulatory Visit (HOSPITAL_COMMUNITY)
Admission: RE | Admit: 2020-03-31 | Discharge: 2020-03-31 | Disposition: A | Discharge status: Home / Self Care | Payer: Medicare HMO | Source: Ambulatory Visit | Attending: Vascular Surgery | Admitting: Vascular Surgery

## 2020-03-31 ENCOUNTER — Other Ambulatory Visit: Payer: Self-pay

## 2020-03-31 VITALS — BP 158/70 | HR 60 | Temp 97.6°F | Resp 20 | Ht 66.5 in | Wt 154.0 lb

## 2020-03-31 DIAGNOSIS — I6523 Occlusion and stenosis of bilateral carotid arteries: Secondary | ICD-10-CM | POA: Diagnosis not present

## 2020-03-31 NOTE — Progress Notes (Signed)
Patient ID: Sarah Nelson, female   DOB: 04/22/1935, 85 y.o.   MRN: [id number]  Reason for Consult: Follow-up   Referred by Benita Stabile, MD  Subjective:     HPI:  Sarah Nelson is a 85 y.o. female history of right-sided transcarotid artery stent.  This was performed after she had a left-sided stroke that was not due to carotid disease.  She remains on aspirin and Plavix.  She has no further neurologic symptoms.  She does have some imbalance but otherwise walks without limitation remains very active.  She does not have any limitation to her walking from a vascular standpoint.  Past Medical History:  Diagnosis Date  . Arthritis   . Carotid artery occlusion   . COVID-19 02/09/2019  . High cholesterol   . Hypertension   . Stroke (HCC) 05/2017  . Thyroid disease    Family History  Problem Relation Age of Onset  . Stroke Mother   . Hypertension Mother   . Cancer Father   . Cancer Brother        brain tumor  . Hypertension Paternal Grandmother   . Diabetes Paternal Grandfather    Past Surgical History:  Procedure Laterality Date  . APPENDECTOMY    . cataracts  2018   removed  . COLONOSCOPY    . LUMBAR FUSION  11/04/2018  . MENISCUS REPAIR Right   . ROTATOR CUFF REPAIR Right   . TRANSCAROTID ARTERY REVASCULARIZATION Right 06/09/2019   Procedure: TRANSCAROTID ARTERY REVASCULARIZATION;  Surgeon: Maeola Harman, MD;  Location: [facility] OR;  Service: Vascular;  Laterality: Right;  . ULTRASOUND GUIDANCE FOR VASCULAR ACCESS Left 06/09/2019   Procedure: Ultrasound Guidance For Vascular Access;  Surgeon: Maeola Harman, MD;  Location: [facility] OR;  Service: Vascular;  Laterality: Left;    Short Social History:  Social History   Tobacco Use  . Smoking status: Never Smoker  . Smokeless tobacco: Never Used  Substance Use Topics  . Alcohol use: Never    No Known Allergies  Current Outpatient Medications  Medication Sig Dispense Refill  . acetaminophen  (TYLENOL) 500 MG tablet Take 500 mg by mouth every 6 (six) hours as needed (pain.).    Marland Kitchen amLODipine (NORVASC) 5 MG tablet Take 5 mg by mouth at bedtime.   5  . aspirin EC 81 MG tablet Take 81 mg by mouth at bedtime.     . clopidogrel (PLAVIX) 75 MG tablet TAKE 1 TABLET BY MOUTH DAILY. 90 tablet 3  . docusate sodium (COLACE) 100 MG capsule Take 100 mg by mouth at bedtime.    Marland Kitchen levothyroxine (SYNTHROID) 112 MCG tablet Take 112 mcg by mouth daily before breakfast.     . rosuvastatin (CRESTOR) 10 MG tablet Take 10 mg by mouth daily.      No current facility-administered medications for this visit.    Review of Systems  Constitutional:  Constitutional negative. HENT: HENT negative.  Eyes: Eyes negative.  Cardiovascular: Cardiovascular negative.  GI: Gastrointestinal negative.  Musculoskeletal: Musculoskeletal negative.  Skin: Skin negative.  Neurological:       Unsteady gait Hematologic: Hematologic/lymphatic negative.  Psychiatric: Psychiatric negative.        Objective:  Objective   Vitals:   03/31/20 1154 03/31/20 1157  BP: (!) 163/72 (!) 158/70  Pulse: 60   Resp: 20   Temp: 97.6 F (36.4 C)   SpO2: 97%   Weight: 154 lb (69.9 kg)   Height: 5' 6.5" (1.689 m)  Body mass index is 24.48 kg/m.  Physical Exam HENT:     Head: Normocephalic.     Nose:     Comments: Wearing a mask Eyes:     Extraocular Movements: Extraocular movements intact.     Pupils: Pupils are equal, round, and reactive to light.  Neck:     Vascular: No carotid bruit.     Comments: Well-healed right neck incision Cardiovascular:     Pulses: Normal pulses.     Heart sounds: Normal heart sounds.  Pulmonary:     Effort: Pulmonary effort is normal.     Breath sounds: Normal breath sounds.  Abdominal:     General: Abdomen is flat.     Palpations: Abdomen is soft.  Musculoskeletal:     Cervical back: Neck supple.  Skin:    General: Skin is warm and dry.  Neurological:     General: No focal  deficit present.     Mental Status: She is alert. Mental status is at baseline.  Psychiatric:        Mood and Affect: Mood normal.        Behavior: Behavior normal.        Thought Content: Thought content normal.        Judgment: Judgment normal.     Data: Right Carotid Findings:  +----------+--------+--------+--------+------------------+--------+       PSV cm/sEDV cm/sStenosisPlaque DescriptionComments  +----------+--------+--------+--------+------------------+--------+  CCA Prox 80   17                      +----------+--------+--------+--------+------------------+--------+  CCA Mid  76   17                      +----------+--------+--------+--------+------------------+--------+  CCA Distal83   18                stent    +----------+--------+--------+--------+------------------+--------+  ICA Prox                      stent    +----------+--------+--------+--------+------------------+--------+  ICA Mid                       stent    +----------+--------+--------+--------+------------------+--------+  ICA Distal92   21                      +----------+--------+--------+--------+------------------+--------+  ECA    226   18       heterogenous         +----------+--------+--------+--------+------------------+--------+   +----------+--------+-------+----------------+-------------------+       PSV cm/sEDV cmsDescribe    Arm Pressure (mmHG)  +----------+--------+-------+----------------+-------------------+  [id number]   3   Multiphasic, WNL            +----------+--------+-------+----------------+-------------------+   +---------+--------+--+--------+--+---------+  VertebralPSV cm/s71EDV cm/s16Antegrade   +---------+--------+--+--------+--+---------+      Right Stent(s):  +---------------+--+--++++  Proximal Stent 7519  +---------------+--+--++++  Mid Stent   8917  +---------------+--+--++++  Distal Stent  9819  +---------------+--+--++++  Distal to [id number]  +---------------+--+--++++          Left Carotid Findings:  +----------+--------+--------+--------+------------------+--------+       PSV cm/sEDV cm/sStenosisPlaque DescriptionComments  +----------+--------+--------+--------+------------------+--------+  CCA Prox 112   22                      +----------+--------+--------+--------+------------------+--------+  CCA Mid  83   17                      +----------+--------+--------+--------+------------------+--------+  CCA Distal104   19       heterogenous         +----------+--------+--------+--------+------------------+--------+  ICA Prox 156   42   40-59% calcific           +----------+--------+--------+--------+------------------+--------+  ICA Mid  100   27                      +----------+--------+--------+--------+------------------+--------+  ICA Distal89   26                      +----------+--------+--------+--------+------------------+--------+  ECA    123   11       heterogenous         +----------+--------+--------+--------+------------------+--------+   +----------+--------+--------+----------------+-------------------+       PSV cm/sEDV cm/sDescribe    Arm Pressure (mmHG)  +----------+--------+--------+----------------+-------------------+  Subclavian152   7    Multiphasic, WNL            +----------+--------+--------+----------------+-------------------+    +---------+--------+--+--------+-+---------+  VertebralPSV cm/s34EDV cm/s7Antegrade  +---------+--------+--+--------+-+---------+         Summary:  Right Carotid: There is no evidence of stenosis in the right ICA. Patent  stent   Left Carotid: Velocities in the left ICA are consistent with a 40-59%  stenosis.   Vertebrals: Bilateral vertebral arteries demonstrate antegrade flow.  Subclavians: Normal flow hemodynamics were seen in bilateral subclavian        arteries.      Assessment/Plan:     85 year old female with history of a left sided stroke found to have high-grade right internal carotid artery stenosis status post stent.  She has done very well from this.  At this time she can probably transition off of aspirin and continue Plavix only.  She will follow-up in 1 year with repeat carotid duplex.    Maeola Harman MD [facility]

## 2020-04-10 DIAGNOSIS — H353131 Nonexudative age-related macular degeneration, bilateral, early dry stage: Secondary | ICD-10-CM | POA: Diagnosis not present

## 2020-04-10 DIAGNOSIS — H52203 Unspecified astigmatism, bilateral: Secondary | ICD-10-CM | POA: Diagnosis not present

## 2020-04-10 DIAGNOSIS — H26492 Other secondary cataract, left eye: Secondary | ICD-10-CM | POA: Diagnosis not present

## 2020-04-10 DIAGNOSIS — H18593 Other hereditary corneal dystrophies, bilateral: Secondary | ICD-10-CM | POA: Diagnosis not present

## 2020-07-17 DIAGNOSIS — Z0100 Encounter for examination of eyes and vision without abnormal findings: Secondary | ICD-10-CM | POA: Diagnosis not present

## 2020-07-31 DIAGNOSIS — E785 Hyperlipidemia, unspecified: Secondary | ICD-10-CM | POA: Diagnosis not present

## 2020-07-31 DIAGNOSIS — E559 Vitamin D deficiency, unspecified: Secondary | ICD-10-CM | POA: Diagnosis not present

## 2020-07-31 DIAGNOSIS — R7303 Prediabetes: Secondary | ICD-10-CM | POA: Diagnosis not present

## 2020-07-31 DIAGNOSIS — E039 Hypothyroidism, unspecified: Secondary | ICD-10-CM | POA: Diagnosis not present

## 2020-08-03 ENCOUNTER — Other Ambulatory Visit: Payer: Self-pay | Admitting: Student

## 2020-08-03 DIAGNOSIS — E0590 Thyrotoxicosis, unspecified without thyrotoxic crisis or storm: Secondary | ICD-10-CM | POA: Diagnosis not present

## 2020-08-03 DIAGNOSIS — R7303 Prediabetes: Secondary | ICD-10-CM | POA: Diagnosis not present

## 2020-08-03 DIAGNOSIS — K219 Gastro-esophageal reflux disease without esophagitis: Secondary | ICD-10-CM | POA: Diagnosis not present

## 2020-08-03 DIAGNOSIS — E559 Vitamin D deficiency, unspecified: Secondary | ICD-10-CM | POA: Diagnosis not present

## 2020-08-03 DIAGNOSIS — E782 Mixed hyperlipidemia: Secondary | ICD-10-CM | POA: Diagnosis not present

## 2020-08-03 DIAGNOSIS — R944 Abnormal results of kidney function studies: Secondary | ICD-10-CM | POA: Diagnosis not present

## 2020-08-03 DIAGNOSIS — E039 Hypothyroidism, unspecified: Secondary | ICD-10-CM | POA: Diagnosis not present

## 2020-08-03 DIAGNOSIS — I679 Cerebrovascular disease, unspecified: Secondary | ICD-10-CM | POA: Diagnosis not present

## 2020-08-03 DIAGNOSIS — I10 Essential (primary) hypertension: Secondary | ICD-10-CM | POA: Diagnosis not present

## 2020-09-01 ENCOUNTER — Ambulatory Visit (HOSPITAL_COMMUNITY)
Admission: RE | Admit: 2020-09-01 | Discharge: 2020-09-01 | Disposition: A | Discharge status: Home / Self Care | Payer: Medicare HMO | Source: Ambulatory Visit | Attending: Student | Admitting: Student

## 2020-09-01 ENCOUNTER — Other Ambulatory Visit: Payer: Self-pay

## 2020-09-01 DIAGNOSIS — E0590 Thyrotoxicosis, unspecified without thyrotoxic crisis or storm: Secondary | ICD-10-CM | POA: Insufficient documentation

## 2020-09-01 DIAGNOSIS — I6522 Occlusion and stenosis of left carotid artery: Secondary | ICD-10-CM | POA: Diagnosis not present

## 2020-09-01 DIAGNOSIS — E041 Nontoxic single thyroid nodule: Secondary | ICD-10-CM | POA: Diagnosis not present

## 2020-09-01 LAB — POCT I-STAT CREATININE: Creatinine, Ser: 1 mg/dL (ref 0.44–1.00)

## 2020-09-01 MED ORDER — IOHEXOL 350 MG/ML SOLN
60.0000 mL | Freq: Once | INTRAVENOUS | Status: AC | PRN
  Administered 2020-09-01: 60 mL via INTRAVENOUS

## 2020-09-07 ENCOUNTER — Other Ambulatory Visit (HOSPITAL_COMMUNITY): Payer: Self-pay | Admitting: Internal Medicine

## 2020-09-07 ENCOUNTER — Other Ambulatory Visit: Payer: Self-pay | Admitting: Internal Medicine

## 2020-09-07 DIAGNOSIS — E041 Nontoxic single thyroid nodule: Secondary | ICD-10-CM

## 2020-09-19 ENCOUNTER — Ambulatory Visit (HOSPITAL_COMMUNITY)
Admission: RE | Admit: 2020-09-19 | Discharge: 2020-09-19 | Disposition: A | Discharge status: Home / Self Care | Payer: Medicare HMO | Source: Ambulatory Visit | Attending: Internal Medicine | Admitting: Internal Medicine

## 2020-09-19 ENCOUNTER — Other Ambulatory Visit: Payer: Self-pay

## 2020-09-19 DIAGNOSIS — E041 Nontoxic single thyroid nodule: Secondary | ICD-10-CM

## 2020-11-06 DIAGNOSIS — E785 Hyperlipidemia, unspecified: Secondary | ICD-10-CM | POA: Diagnosis not present

## 2020-11-06 DIAGNOSIS — I10 Essential (primary) hypertension: Secondary | ICD-10-CM | POA: Diagnosis not present

## 2021-01-12 DIAGNOSIS — I10 Essential (primary) hypertension: Secondary | ICD-10-CM | POA: Diagnosis not present

## 2021-01-12 DIAGNOSIS — E559 Vitamin D deficiency, unspecified: Secondary | ICD-10-CM | POA: Diagnosis not present

## 2021-01-12 DIAGNOSIS — Z79899 Other long term (current) drug therapy: Secondary | ICD-10-CM | POA: Diagnosis not present

## 2021-01-17 DIAGNOSIS — E782 Mixed hyperlipidemia: Secondary | ICD-10-CM | POA: Diagnosis not present

## 2021-01-17 DIAGNOSIS — K219 Gastro-esophageal reflux disease without esophagitis: Secondary | ICD-10-CM | POA: Diagnosis not present

## 2021-01-17 DIAGNOSIS — R944 Abnormal results of kidney function studies: Secondary | ICD-10-CM | POA: Diagnosis not present

## 2021-01-17 DIAGNOSIS — E039 Hypothyroidism, unspecified: Secondary | ICD-10-CM | POA: Diagnosis not present

## 2021-01-17 DIAGNOSIS — Z0001 Encounter for general adult medical examination with abnormal findings: Secondary | ICD-10-CM | POA: Diagnosis not present

## 2021-01-17 DIAGNOSIS — N1831 Chronic kidney disease, stage 3a: Secondary | ICD-10-CM | POA: Diagnosis not present

## 2021-01-17 DIAGNOSIS — R7303 Prediabetes: Secondary | ICD-10-CM | POA: Diagnosis not present

## 2021-01-17 DIAGNOSIS — E559 Vitamin D deficiency, unspecified: Secondary | ICD-10-CM | POA: Diagnosis not present

## 2021-01-17 DIAGNOSIS — I679 Cerebrovascular disease, unspecified: Secondary | ICD-10-CM | POA: Diagnosis not present

## 2021-01-17 DIAGNOSIS — I10 Essential (primary) hypertension: Secondary | ICD-10-CM | POA: Diagnosis not present

## 2021-01-27 IMAGING — CT CT ANGIO NECK
1 of 4 series · 2 of 16 positions shown · IV contrast (APPLIED)
Comparison: None.

CT/CT angiogram of the head and neck August 01, 2017.

CLINICAL DATA: Carotid artery stenosis.

EXAM:
CT ANGIOGRAPHY HEAD AND NECK
TECHNIQUE: Multidetector CT imaging of the head and neck was performed using
the standard protocol during bolus administration of intravenous
contrast. Multiplanar CT image reconstructions and MIPs were
obtained to evaluate the vascular anatomy. Carotid stenosis
measurements (when applicable) are obtained utilizing NASCET
criteria, using the distal internal carotid diameter as the
denominator.
CONTRAST:  75mL [id number] IOPAMIDOL ([id number]) INJECTION 76%

[Series 10: head/neck angio · axial · 0.39mm/px · z∈[+311,+435]mm · 2 of 187 slices shown]
[im 63/187  soft-tissue]
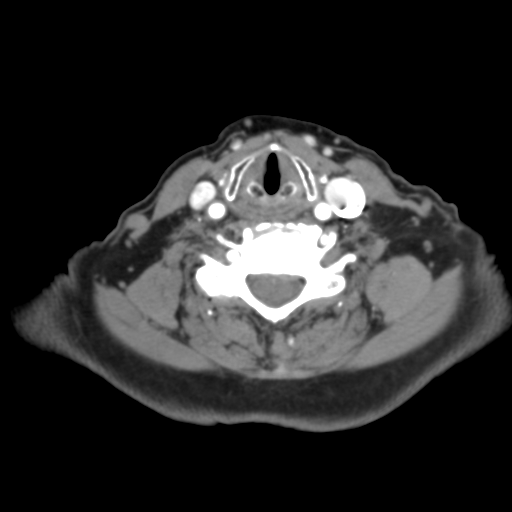
[im 125/187  bone]
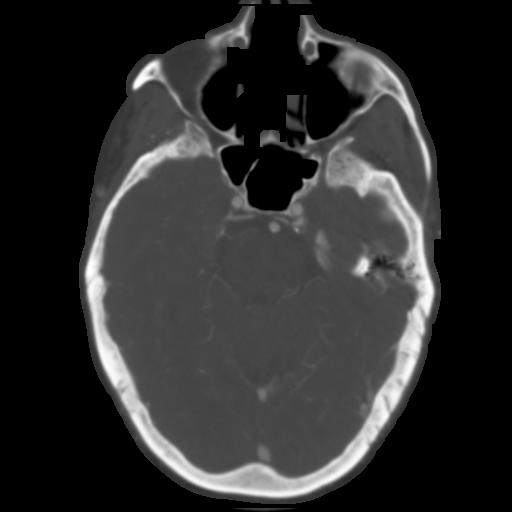

[2 of 16 positions shown; findings below may reference images not displayed]

FINDINGS: CT HEAD FINDINGS

Brain: No evidence of acute infarction, hemorrhage, hydrocephalus,
extra-axial collection or mass lesion/mass effect.

Mild periventricular hypodensity, nonspecific, most likely related
to chronic small vessel ischemia.

Vascular: No hyperdense vessel or unexpected calcification.

Skull: Normal. Negative for fracture or focal lesion.

Sinuses/Orbits: No acute finding.

CTA NECK FINDINGS

Aortic arch: Four vessel aortic arch with left vertebral artery
originating directly from the order, with seen the origin of the
left common carotid artery and left subclavian artery. Imaged
portion shows no evidence of aneurysm or dissection. Atherosclerotic
plaques are seen in the aortic arch including at the origin of the
left vertebral artery which appear narrowed. However, evaluation for
caliber of the origin of the left vertebral artery is limited due to
the calcification and artifact from dense contrast bolus in the left
brachiocephalic vein.

Right carotid system: Atherosclerotic changes are noted in the
distal right common carotid artery with mixed density plaques,
predominantly soft, resulting in 80% stenosis when compared to the
proximal right common carotid artery and approximately 65% when
compared to the cervical right ICA. Mild atherosclerotic changes are
also noted in the carotid bulb.

Left carotid system: Atherosclerotic changes are noted in the left
carotid bifurcation with mixed density plaques without
hemodynamically significant stenosis

Vertebral arteries: Dominant right vertebral artery. The left
vertebral artery origin is directly from the aortic arch.
Atherosclerotic changes noted at the origin of the left vertebral
artery, as described above with undetermined degree of stenosis. No
evidence of dissection or occlusion.

Skeleton: Degenerative changes of the cervical spine with small
anterolisthesis of C3 over C4, C4 over C5 and C7 over T1. There is
moderate bilateral neural foraminal stenosis at C6-7.

Other neck: Enlarged and heterogeneous thyroid gland with a 9 mm
enhancing nodule in the right lobe and a 3.5 mm nodule in the left
lobe with a calcified rim.

Upper chest: Mild apical scarring bilaterally.

Review of the MIP images confirms the above findings

CTA HEAD FINDINGS

Anterior circulation: No significant stenosis, proximal occlusion,
aneurysm, or vascular malformation. Small calcified plaques are seen
in the bilateral carotid siphons.

Posterior circulation: No significant stenosis, proximal occlusion,
aneurysm, or vascular malformation. Tiny calcified plaque in the mid
basilar artery. Small calcific plaque is also noted in the right
vertebral artery at the V3-V4 junction.

Venous sinuses: As permitted by contrast timing, patent.

Anatomic variants: Duplicated anterior communicating artery.

Review of the MIP images confirms the above findings
IMPRESSION: 1. No acute intracranial abnormality.
2. Mild chronic small vessel ischemic changes.
3. Atherosclerotic changes in the distal right common carotid artery
with 80% stenosis. When compared to the diameter of the distal right
internal carotid artery, the stenosis would be graded at 65%,
similar to prior CT angiogram.
4. Atherosclerotic changes in the left carotid bifurcation without
hemodynamically significant stenosis.
5. Dominant right vertebral artery. Atherosclerotic changes at the
origin of the left vertebral artery which appear narrowed. However,
evaluation for degree of stenosis is limited due to calcification
and artifact from dense contrast bolus in the left brachiocephalic
vein.
6. Degenerative changes of the cervical spine with moderate
bilateral neural foraminal stenosis at C6-7.
7. Bilateral thyroid nodules. Recommend thyroid ultrasound.

Aortic Atherosclerosis ([id number]).

## 2021-01-28 ENCOUNTER — Other Ambulatory Visit: Payer: Self-pay | Admitting: Vascular Surgery

## 2021-04-08 ENCOUNTER — Other Ambulatory Visit: Payer: Self-pay

## 2021-04-08 DIAGNOSIS — I6523 Occlusion and stenosis of bilateral carotid arteries: Secondary | ICD-10-CM

## 2021-04-11 DIAGNOSIS — H52203 Unspecified astigmatism, bilateral: Secondary | ICD-10-CM | POA: Diagnosis not present

## 2021-04-11 DIAGNOSIS — H26493 Other secondary cataract, bilateral: Secondary | ICD-10-CM | POA: Diagnosis not present

## 2021-04-11 DIAGNOSIS — H353132 Nonexudative age-related macular degeneration, bilateral, intermediate dry stage: Secondary | ICD-10-CM | POA: Diagnosis not present

## 2021-04-11 DIAGNOSIS — H18593 Other hereditary corneal dystrophies, bilateral: Secondary | ICD-10-CM | POA: Diagnosis not present

## 2021-04-12 NOTE — Progress Notes (Signed)
?HISTORY AND PHYSICAL  ? ? ? ?CC:  follow up. ?Requesting Provider:  Celene Squibb, MD ? ?HPI: This is a 86 y.o. female here for follow up for carotid artery stenosis.  Pt is s/p right TCAR for asymptomatic carotid artery stenosis on 06/09/2019 by Dr. Donzetta Matters.  She has hx of left MCA stroke not related to her carotid disease and during workup she was found to have right ICA stenosis.   ? ?Pt was last seen 03/31/2020 and at that time she was not having any further neurologic sx but did have some imbalance but otherwise was walking without limitation and remained active.  It was felt she could transition off of asa and remain on plavix only.   ? ?Pt returns today for follow up.   ? ?Pt denies any amaurosis fugax, speech difficulties, weakness, numbness, paralysis or clumsiness or facial droop.  She does have visual disturbance left eye that is unchanged since her stroke.   ? ?She states that she does have some balance issues but no dizziness.  She can work in the yard for a couple hours at a time but not as active due to her balance issues.   ? ?The pt is on a statin for cholesterol management.  ?The pt is not on a daily aspirin.   Other AC:  Plavix ?The pt is on CCB for hypertension.   ?The pt is not diabetic.   ?Tobacco hx:  never ? ?Pt does not have family hx of AAA. ? ?Past Medical History:  ?Diagnosis Date  ? Arthritis   ? Carotid artery occlusion   ? COVID-19 02/09/2019  ? High cholesterol   ? Hypertension   ? Stroke [facility]) 05/2017  ? Thyroid disease   ? ? ?Past Surgical History:  ?Procedure Laterality Date  ? APPENDECTOMY    ? cataracts  2018  ? removed  ? COLONOSCOPY    ? LUMBAR FUSION  11/04/2018  ? MENISCUS REPAIR Right   ? ROTATOR CUFF REPAIR Right   ? TRANSCAROTID ARTERY REVASCULARIZATION?  Right 06/09/2019  ? Procedure: TRANSCAROTID ARTERY REVASCULARIZATION;  Surgeon: Waynetta Sandy, MD;  Location: [facility];  Service: Vascular;  Laterality: Right;  ? ULTRASOUND GUIDANCE FOR VASCULAR ACCESS Left 06/09/2019  ?  Procedure: Ultrasound Guidance For Vascular Access;  Surgeon: Waynetta Sandy, MD;  Location: [place];  Service: Vascular;  Laterality: Left;  ? ? ?No Known Allergies ? ?Current Outpatient Medications  ?Medication Sig Dispense Refill  ? acetaminophen (TYLENOL) 500 MG tablet Take 500 mg by mouth every 6 (six) hours as needed (pain.).    ? amLODipine (NORVASC) 5 MG tablet Take 5 mg by mouth at bedtime.   5  ? aspirin EC 81 MG tablet Take 81 mg by mouth at bedtime.     ? clopidogrel (PLAVIX) 75 MG tablet TAKE 1 TABLET BY MOUTH EVERY DAY 90 tablet 3  ? docusate sodium (COLACE) 100 MG capsule Take 100 mg by mouth at bedtime.    ? levothyroxine (SYNTHROID) 112 MCG tablet Take 112 mcg by mouth daily before breakfast.     ? rosuvastatin (CRESTOR) 10 MG tablet Take 10 mg by mouth daily.     ? ?No current facility-administered medications for this visit.  ? ? ?Family History  ?Problem Relation Age of Onset  ? Stroke Mother   ? Hypertension Mother   ? Cancer Father   ? Cancer Brother   ?     brain tumor  ? Hypertension Paternal  Grandmother   ? Diabetes Paternal Grandfather   ? ? ?Social History  ? ?Socioeconomic History  ? Marital status: Married  ?  Spouse name: Not on file  ? Number of children: Not on file  ? Years of education: Not on file  ? Highest education level: Not on file  ?Occupational History  ? Not on file  ?Tobacco Use  ? Smoking status: Never  ? Smokeless tobacco: Never  ?Vaping Use  ? Vaping Use: Never used  ?Substance and Sexual Activity  ? Alcohol use: Never  ? Drug use: Never  ? Sexual activity: Not on file  ?Other Topics Concern  ? Not on file  ?Social History Narrative  ? Lives with husband Gershon Mussel.  Is retired.  Education 12th grade.  Children 3.    ? ?Social Determinants of Health  ? ?Financial Resource Strain: Not on file  ?Food Insecurity: Not on file  ?Transportation Needs: Not on file  ?Physical Activity: Not on file  ?Stress: Not on file  ?Social Connections: Not on file  ?Intimate Partner  Violence: Not on file  ? ? ? ?REVIEW OF SYSTEMS:  ? ?[X]  denotes positive finding, [ ]  denotes negative finding ?Cardiac  Comments:  ?Chest pain or chest pressure:    ?Shortness of breath upon exertion:    ?Short of breath when lying flat:    ?Irregular heart rhythm:    ?    ?Vascular    ?Pain in calf, thigh, or hip brought on by ambulation:    ?Pain in feet at night that wakes you up from your sleep:     ?Blood clot in your veins:    ?Leg swelling:     ?    ?Pulmonary    ?Oxygen at home:    ?Productive cough:     ?Wheezing:     ?    ?Neurologic    ?Sudden weakness in arms or legs:     ?Sudden numbness in arms or legs:     ?Sudden onset of difficulty speaking or slurred speech:    ?Temporary loss of vision in one eye:     ?Problems with dizziness:     ?    ?Gastrointestinal    ?Blood in stool:     ?Vomited blood:     ?    ?Genitourinary    ?Burning when urinating:     ?Blood in urine:    ?    ?Psychiatric    ?Major depression:     ?    ?Hematologic    ?Bleeding problems:    ?Problems with blood clotting too easily:    ?    ?Skin    ?Rashes or ulcers:    ?    ?Constitutional    ?Fever or chills:    ? ? ?PHYSICAL EXAMINATION: ? ?Today's Vitals  ? 04/18/21 1320 04/18/21 1321  ?BP: (!) 158/69 (!) 165/75  ?Pulse: 70 67  ?Temp: (!) 97.4 ?F (36.3 ?C)   ?Weight: 152 lb 9.6 oz (69.2 kg)   ?Height: 5' 6.5" (1.689 m)   ? ?Body mass index is 24.26 kg/m?. ? ? ?General:  WDWN in NAD; vital signs documented above ?Gait: Not observed ?HENT: WNL, normocephalic ?Pulmonary: normal non-labored breathing ?Cardiac: regular HR, without carotid bruits ?Abdomen: soft, NT; aortic pulse is not palpable ?Skin: without rashes ?Vascular Exam/Pulses: ? Right Left  ?Radial 2+ (normal) 2+ (normal)  ?AT 2+ (normal) 2+ (normal)  ? ?Extremities: without ischemic changes, without Gangrene , without  cellulitis; without open wounds ?Musculoskeletal: no muscle wasting or atrophy  ?Neurologic: A&O X 3; moving all extremities equally; speech is  fluent/normal ?Psychiatric:  The pt has Normal affect. ? ? ?Non-Invasive Vascular Imaging:   ?Carotid Duplex on 04/18/2021: ?Right:  normal  ?Left:  1-39% ICA stenosis ?Vertebrals:  Bilateral vertebral arteries demonstrate antegrade flow.  ?Subclavians: Normal flow hemodynamics were seen in bilateral subclavian arteries.  ? ?Previous Carotid duplex on 03/31/2020: ?Right: no evidence of ICA stenosis ?Left:   40-59% ICA stenosis ? ? ? ?ASSESSMENT/PLAN:: 86 y.o. female here for follow up carotid artery stenosis and is s/p right TCAR for asymptomatic carotid artery stenosis on 06/09/2019 by Dr. Donzetta Matters.  ? ?-duplex today reveals stent is patent and the left is in the 1-39% range, which is down from last visit.  She was in the low range of the 40-59% at last visit.   ?-discussed s/s of stroke with pt and she understands should she develop any of these sx, she will go to the nearest ER or call 911. ?-pt will f/u in one year with carotid duplex ?-pt will call sooner should they have any issues. ?-continue statin/plavix ? ?Leontine Locket, PAC ?[facility] ?[phone] ? ?Clinic MD:  [place] ? ?

## 2021-04-18 ENCOUNTER — Encounter: Payer: Self-pay | Admitting: Physician Assistant

## 2021-04-18 ENCOUNTER — Ambulatory Visit (HOSPITAL_COMMUNITY)
Admission: RE | Admit: 2021-04-18 | Discharge: 2021-04-18 | Disposition: A | Discharge status: Home / Self Care | Payer: Medicare HMO | Source: Ambulatory Visit | Attending: Vascular Surgery | Admitting: Vascular Surgery

## 2021-04-18 ENCOUNTER — Ambulatory Visit: Payer: Medicare HMO | Admitting: Physician Assistant

## 2021-04-18 VITALS — BP 165/75 | HR 67 | Temp 97.4°F | Ht 66.5 in | Wt 152.6 lb

## 2021-04-18 DIAGNOSIS — I6523 Occlusion and stenosis of bilateral carotid arteries: Secondary | ICD-10-CM | POA: Diagnosis not present

## 2021-05-15 DIAGNOSIS — H26492 Other secondary cataract, left eye: Secondary | ICD-10-CM | POA: Diagnosis not present

## 2021-07-03 DIAGNOSIS — H26491 Other secondary cataract, right eye: Secondary | ICD-10-CM | POA: Diagnosis not present

## 2021-07-16 DIAGNOSIS — Z0100 Encounter for examination of eyes and vision without abnormal findings: Secondary | ICD-10-CM | POA: Diagnosis not present

## 2021-07-27 DIAGNOSIS — R5383 Other fatigue: Secondary | ICD-10-CM | POA: Diagnosis not present

## 2021-07-27 DIAGNOSIS — E039 Hypothyroidism, unspecified: Secondary | ICD-10-CM | POA: Diagnosis not present

## 2021-07-27 DIAGNOSIS — I10 Essential (primary) hypertension: Secondary | ICD-10-CM | POA: Diagnosis not present

## 2021-07-27 DIAGNOSIS — E559 Vitamin D deficiency, unspecified: Secondary | ICD-10-CM | POA: Diagnosis not present

## 2021-07-27 DIAGNOSIS — R7301 Impaired fasting glucose: Secondary | ICD-10-CM | POA: Diagnosis not present

## 2021-08-03 DIAGNOSIS — R2689 Other abnormalities of gait and mobility: Secondary | ICD-10-CM | POA: Diagnosis not present

## 2021-08-03 DIAGNOSIS — E039 Hypothyroidism, unspecified: Secondary | ICD-10-CM | POA: Diagnosis not present

## 2021-08-03 DIAGNOSIS — R7303 Prediabetes: Secondary | ICD-10-CM | POA: Diagnosis not present

## 2021-08-03 DIAGNOSIS — K219 Gastro-esophageal reflux disease without esophagitis: Secondary | ICD-10-CM | POA: Diagnosis not present

## 2021-08-03 DIAGNOSIS — I679 Cerebrovascular disease, unspecified: Secondary | ICD-10-CM | POA: Diagnosis not present

## 2021-08-03 DIAGNOSIS — E782 Mixed hyperlipidemia: Secondary | ICD-10-CM | POA: Diagnosis not present

## 2021-08-03 DIAGNOSIS — N1831 Chronic kidney disease, stage 3a: Secondary | ICD-10-CM | POA: Diagnosis not present

## 2021-08-03 DIAGNOSIS — R5383 Other fatigue: Secondary | ICD-10-CM | POA: Diagnosis not present

## 2021-08-03 DIAGNOSIS — Z0001 Encounter for general adult medical examination with abnormal findings: Secondary | ICD-10-CM | POA: Diagnosis not present

## 2021-08-03 DIAGNOSIS — I10 Essential (primary) hypertension: Secondary | ICD-10-CM | POA: Diagnosis not present

## 2021-08-03 DIAGNOSIS — E559 Vitamin D deficiency, unspecified: Secondary | ICD-10-CM | POA: Diagnosis not present

## 2022-01-30 DIAGNOSIS — I10 Essential (primary) hypertension: Secondary | ICD-10-CM | POA: Diagnosis not present

## 2022-01-30 DIAGNOSIS — E039 Hypothyroidism, unspecified: Secondary | ICD-10-CM | POA: Diagnosis not present

## 2022-02-03 ENCOUNTER — Other Ambulatory Visit: Payer: Self-pay | Admitting: Vascular Surgery

## 2022-02-05 DIAGNOSIS — K219 Gastro-esophageal reflux disease without esophagitis: Secondary | ICD-10-CM | POA: Diagnosis not present

## 2022-02-05 DIAGNOSIS — Z8673 Personal history of transient ischemic attack (TIA), and cerebral infarction without residual deficits: Secondary | ICD-10-CM | POA: Diagnosis not present

## 2022-02-05 DIAGNOSIS — E559 Vitamin D deficiency, unspecified: Secondary | ICD-10-CM | POA: Diagnosis not present

## 2022-02-05 DIAGNOSIS — I129 Hypertensive chronic kidney disease with stage 1 through stage 4 chronic kidney disease, or unspecified chronic kidney disease: Secondary | ICD-10-CM | POA: Diagnosis not present

## 2022-02-05 DIAGNOSIS — I10 Essential (primary) hypertension: Secondary | ICD-10-CM | POA: Diagnosis not present

## 2022-02-05 DIAGNOSIS — Z0000 Encounter for general adult medical examination without abnormal findings: Secondary | ICD-10-CM | POA: Diagnosis not present

## 2022-02-05 DIAGNOSIS — E039 Hypothyroidism, unspecified: Secondary | ICD-10-CM | POA: Diagnosis not present

## 2022-02-05 DIAGNOSIS — N1831 Chronic kidney disease, stage 3a: Secondary | ICD-10-CM | POA: Diagnosis not present

## 2022-02-05 DIAGNOSIS — R2689 Other abnormalities of gait and mobility: Secondary | ICD-10-CM | POA: Diagnosis not present

## 2022-02-05 DIAGNOSIS — R7303 Prediabetes: Secondary | ICD-10-CM | POA: Diagnosis not present

## 2022-02-05 DIAGNOSIS — R5383 Other fatigue: Secondary | ICD-10-CM | POA: Diagnosis not present

## 2022-02-05 DIAGNOSIS — E782 Mixed hyperlipidemia: Secondary | ICD-10-CM | POA: Diagnosis not present

## 2022-04-17 DIAGNOSIS — H18593 Other hereditary corneal dystrophies, bilateral: Secondary | ICD-10-CM | POA: Diagnosis not present

## 2022-04-17 DIAGNOSIS — H43813 Vitreous degeneration, bilateral: Secondary | ICD-10-CM | POA: Diagnosis not present

## 2022-04-17 DIAGNOSIS — D23111 Other benign neoplasm of skin of right upper eyelid, including canthus: Secondary | ICD-10-CM | POA: Diagnosis not present

## 2022-04-17 DIAGNOSIS — H353132 Nonexudative age-related macular degeneration, bilateral, intermediate dry stage: Secondary | ICD-10-CM | POA: Diagnosis not present

## 2022-04-17 DIAGNOSIS — H524 Presbyopia: Secondary | ICD-10-CM | POA: Diagnosis not present

## 2022-04-30 DIAGNOSIS — R2689 Other abnormalities of gait and mobility: Secondary | ICD-10-CM | POA: Diagnosis not present

## 2022-04-30 DIAGNOSIS — Z79899 Other long term (current) drug therapy: Secondary | ICD-10-CM | POA: Diagnosis not present

## 2022-04-30 DIAGNOSIS — I10 Essential (primary) hypertension: Secondary | ICD-10-CM | POA: Diagnosis not present

## 2022-04-30 DIAGNOSIS — I679 Cerebrovascular disease, unspecified: Secondary | ICD-10-CM | POA: Diagnosis not present

## 2022-05-01 DIAGNOSIS — H18593 Other hereditary corneal dystrophies, bilateral: Secondary | ICD-10-CM | POA: Diagnosis not present

## 2022-05-14 ENCOUNTER — Other Ambulatory Visit: Payer: Self-pay | Admitting: *Deleted

## 2022-05-14 DIAGNOSIS — I6523 Occlusion and stenosis of bilateral carotid arteries: Secondary | ICD-10-CM

## 2022-05-15 DIAGNOSIS — R42 Dizziness and giddiness: Secondary | ICD-10-CM | POA: Diagnosis not present

## 2022-05-15 DIAGNOSIS — I10 Essential (primary) hypertension: Secondary | ICD-10-CM | POA: Diagnosis not present

## 2022-05-15 DIAGNOSIS — Z79899 Other long term (current) drug therapy: Secondary | ICD-10-CM | POA: Diagnosis not present

## 2022-05-22 ENCOUNTER — Encounter: Payer: Self-pay | Admitting: Physician Assistant

## 2022-05-22 ENCOUNTER — Ambulatory Visit (HOSPITAL_COMMUNITY)
Admission: RE | Admit: 2022-05-22 | Discharge: 2022-05-22 | Disposition: A | Discharge status: Home / Self Care | Payer: Medicare HMO | Source: Ambulatory Visit | Attending: Vascular Surgery | Admitting: Vascular Surgery

## 2022-05-22 ENCOUNTER — Ambulatory Visit: Payer: Medicare HMO | Admitting: Physician Assistant

## 2022-05-22 VITALS — BP 144/72 | HR 70 | Temp 97.6°F | Wt 152.0 lb

## 2022-05-22 DIAGNOSIS — I6521 Occlusion and stenosis of right carotid artery: Secondary | ICD-10-CM | POA: Diagnosis not present

## 2022-05-22 DIAGNOSIS — I6523 Occlusion and stenosis of bilateral carotid arteries: Secondary | ICD-10-CM | POA: Diagnosis not present

## 2022-05-22 DIAGNOSIS — Z95828 Presence of other vascular implants and grafts: Secondary | ICD-10-CM

## 2022-05-22 NOTE — Progress Notes (Signed)
Office Note     CC:  follow up Requesting Provider:  Benita Stabile, MD  HPI: Sarah Nelson is a 87 y.o. (07/07/1935) female who presents for routine follow up of carotid artery stenosis. She has history of right TCAR for asymptomatic high grade stenosis in June of 2021 by Dr. Randie Heinz. She does have history of Left MCA stroke unrelated to her right ICA stenosis, but it was at that time that her ICA stenosis was identified. She has otherwise been asymptomatic.  Today she returns with carotid duplex. She denies any amaurosis fugax or other visual changes. She did just have recent eye surgery on her cornea 3 weeks ago. She otherwise denies any slurred speech, facial drooping, unilateral upper or lower extremity weakness or numbness. She does have some balance trouble that limits her activity this has been present since her stroke. She says she has learned to cope with this. She has been having dizziness as well, although she describes it more as a lightheadedness or floating feeling. She has had vertigo before and explains that her current feeling is different from the vertigo. She thought maybe it was blood pressure related but she did just see her PCP recently and had her medications adjusted. She does not have any pain in her legs on ambulation or rest. She lives with her husband and says she tries to stay as active as she can.  She mentions that her sister had to have a stent placed in her Aorta and asks if she can be evaluated for this. She does not recall that it was for aneurysmal disease but thinks it was more due to occlusive disease. She has no other family history of AAA that she is aware of.  The pt is on a statin for cholesterol management.  The pt is not on a daily aspirin.   Other AC:  Plavix The pt is on CCB for hypertension.   The pt is not diabetic.   Tobacco hx:  never  Past Medical History:  Diagnosis Date   Arthritis    Carotid artery occlusion    COVID-19 02/09/2019    High cholesterol    Hypertension    Stroke ([facility]) 05/2017   Thyroid disease     Past Surgical History:  Procedure Laterality Date   APPENDECTOMY     cataracts  2018   removed   COLONOSCOPY     LUMBAR FUSION  11/04/2018   MENISCUS REPAIR Right    ROTATOR CUFF REPAIR Right    TRANSCAROTID ARTERY REVASCULARIZATION  Right 06/09/2019   Procedure: TRANSCAROTID ARTERY REVASCULARIZATION;  Surgeon: Maeola Harman, MD;  Location[facility] OR;  Service: Vascular;  Laterality: Right;   ULTRASOUND GUIDANCE FOR VASCULAR ACCESS Left 06/09/2019   Procedure: Ultrasound Guidance For Vascular Access;  Surgeon: Maeola Harman, MD;  Location: [facility] OR;  Service: Vascular;  Laterality: Left;    Social History   Socioeconomic History   Marital status: Married    Spouse name: Not on file   Number of children: Not on file   Years of education: Not on file   Highest education level: Not on file  Occupational History   Not on file  Tobacco Use   Smoking status: Never   Smokeless tobacco: Never  Vaping Use   Vaping Use: Never used  Substance and Sexual Activity   Alcohol use: Never   Drug use: Never   Sexual activity: Not on file  Other Topics Concern   Not on  file  Social History Narrative   Lives with husband Elijah Birk.  Is retired.  Education 12th grade.  Children 3.     Social Determinants of Health   Financial Resource Strain: Not on file  Food Insecurity: Not on file  Transportation Needs: Not on file  Physical Activity: Not on file  Stress: Not on file  Social Connections: Not on file  Intimate Partner Violence: Not on file    Family History  Problem Relation Age of Onset   Stroke Mother    Hypertension Mother    Cancer Father    Cancer Brother        brain tumor   Hypertension Paternal Grandmother    Diabetes Paternal Grandfather     Current Outpatient Medications  Medication Sig Dispense Refill   acetaminophen (TYLENOL) 500 MG tablet Take 500 mg by mouth every 6  (six) hours as needed (pain.).     amLODipine (NORVASC) 10 MG tablet Take 5 mg by mouth at bedtime.   5   calcium-vitamin D (OSCAL WITH D) 500-5 MG-MCG tablet Take 1 tablet by mouth.     clopidogrel (PLAVIX) 75 MG tablet TAKE 1 TABLET BY MOUTH EVERY DAY 90 tablet 3   docusate sodium (COLACE) 100 MG capsule Take 100 mg by mouth at bedtime.     levothyroxine (SYNTHROID) 125 MCG tablet Take 112 mcg by mouth daily before breakfast.      losartan (COZAAR) 50 MG tablet Take 50 mg by mouth daily.     OVER THE COUNTER MEDICATION Take 1 tablet by mouth 2 (two) times daily. AREDS     rosuvastatin (CRESTOR) 10 MG tablet Take 10 mg by mouth daily.      vitamin B-12 (CYANOCOBALAMIN) 100 MCG tablet Take 100 mcg by mouth daily.     No current facility-administered medications for this visit.    No Known Allergies   REVIEW OF SYSTEMS:   [X]  denotes positive finding, [ ]  denotes negative finding Cardiac  Comments:  Chest pain or chest pressure:    Shortness of breath upon exertion:    Short of breath when lying flat:    Irregular heart rhythm:        Vascular    Pain in calf, thigh, or hip brought on by ambulation:    Pain in feet at night that wakes you up from your sleep:     Blood clot in your veins:    Leg swelling:         Pulmonary    Oxygen at home:    Productive cough:     Wheezing:         Neurologic    Sudden weakness in arms or legs:     Sudden numbness in arms or legs:     Sudden onset of difficulty speaking or slurred speech:    Temporary loss of vision in one eye:     Problems with dizziness:         Gastrointestinal    Blood in stool:     Vomited blood:         Genitourinary    Burning when urinating:     Blood in urine:        Psychiatric    Major depression:         Hematologic    Bleeding problems:    Problems with blood clotting too easily:        Skin    Rashes or ulcers:  Constitutional    Fever or chills:      PHYSICAL  EXAMINATION:  Vitals:   05/22/22 1302 05/22/22 1303  BP: 133/67 (!) 144/72  Pulse: 70   Temp: 97.6 F (36.4 C)   TempSrc: Temporal   SpO2: 97%   Weight: 152 lb (68.9 kg)     General:  WDWN in NAD; vital signs documented above Gait: Normal HENT: WNL, normocephalic Pulmonary: normal non-labored breathing , without Rales, rhonchi,  wheezing Cardiac: regular HR Abdomen: soft  Vascular Exam/Pulses: 2+ radial, 2+ DP and PT pulses bilaterally. Moving all extremities without deficits Extremities: without ischemic changes, without Gangrene , without cellulitis; without open wounds;  Musculoskeletal: no muscle wasting or atrophy  Neurologic: A&O X 3 Psychiatric:  The pt has Normal affect.   Non-Invasive Vascular Imaging:   VAS US Carotid Duplex: Summary:  Right Carotid: Patent right ICA stent without evidence of hemodynamically significant stenosis.   Left Carotid: Velocities in the left ICA are consistent with a 1-39% stenosis.   Vertebrals: Bilateral vertebral arteries demonstrate antegrade flow.  Subclavians: Normal flow hemodynamics were seen in bilateral subclavian arteries.    ASSESSMENT/PLAN:: 87 y.o. female here for routine follow up of carotid artery stenosis. She has history of right TCAR for asymptomatic high grade stenosis in June of 2021 by Dr. Randie Heinz.  She is without any associated carotid symptoms. She is having balance and dizziness issues. I would be concerned that this may be vertebrobasilar symptoms, but she has normal antegrade flow in her vertebral arteries. I suspect she either has intercranial disease or it is related to some other etiology. She may benefit from seeing Neurology for further evaluation.  - Duplex today shows patent right ICA stent. Left ICA with 1-39% stenosis, which is unchanged. She has normal flow in her vertebral and subclavian arteries.  - Continue adequate blood pressure control - Continue Plavix and statin - She can call to have screening AAA  after she discusses with her PCP - She will follow up in 1 year with repeat carotid duplex   Graceann Congress, PA-C [facility] [phone]  Clinic MD:   [place]/ Randie Heinz

## 2022-05-30 ENCOUNTER — Other Ambulatory Visit: Payer: Self-pay

## 2022-05-30 DIAGNOSIS — I6523 Occlusion and stenosis of bilateral carotid arteries: Secondary | ICD-10-CM

## 2022-06-05 DIAGNOSIS — R42 Dizziness and giddiness: Secondary | ICD-10-CM | POA: Diagnosis not present

## 2022-06-05 DIAGNOSIS — I10 Essential (primary) hypertension: Secondary | ICD-10-CM | POA: Diagnosis not present

## 2022-06-05 DIAGNOSIS — Z79899 Other long term (current) drug therapy: Secondary | ICD-10-CM | POA: Diagnosis not present

## 2022-06-06 ENCOUNTER — Other Ambulatory Visit (HOSPITAL_COMMUNITY): Payer: Self-pay | Admitting: Family Medicine

## 2022-06-06 DIAGNOSIS — R42 Dizziness and giddiness: Secondary | ICD-10-CM

## 2022-07-03 ENCOUNTER — Ambulatory Visit (HOSPITAL_COMMUNITY): Payer: Medicare HMO

## 2022-07-03 ENCOUNTER — Encounter (HOSPITAL_COMMUNITY): Payer: Self-pay

## 2022-07-15 DIAGNOSIS — H18593 Other hereditary corneal dystrophies, bilateral: Secondary | ICD-10-CM | POA: Diagnosis not present

## 2022-07-15 DIAGNOSIS — H04123 Dry eye syndrome of bilateral lacrimal glands: Secondary | ICD-10-CM | POA: Diagnosis not present

## 2022-07-20 DIAGNOSIS — Z0100 Encounter for examination of eyes and vision without abnormal findings: Secondary | ICD-10-CM | POA: Diagnosis not present

## 2022-08-20 DIAGNOSIS — E039 Hypothyroidism, unspecified: Secondary | ICD-10-CM | POA: Diagnosis not present

## 2022-08-20 DIAGNOSIS — I10 Essential (primary) hypertension: Secondary | ICD-10-CM | POA: Diagnosis not present

## 2022-08-28 DIAGNOSIS — Z0001 Encounter for general adult medical examination with abnormal findings: Secondary | ICD-10-CM | POA: Diagnosis not present

## 2022-08-28 DIAGNOSIS — E559 Vitamin D deficiency, unspecified: Secondary | ICD-10-CM | POA: Diagnosis not present

## 2022-08-28 DIAGNOSIS — K219 Gastro-esophageal reflux disease without esophagitis: Secondary | ICD-10-CM | POA: Diagnosis not present

## 2022-08-28 DIAGNOSIS — I129 Hypertensive chronic kidney disease with stage 1 through stage 4 chronic kidney disease, or unspecified chronic kidney disease: Secondary | ICD-10-CM | POA: Diagnosis not present

## 2022-08-28 DIAGNOSIS — R5383 Other fatigue: Secondary | ICD-10-CM | POA: Diagnosis not present

## 2022-08-28 DIAGNOSIS — Z0000 Encounter for general adult medical examination without abnormal findings: Secondary | ICD-10-CM | POA: Diagnosis not present

## 2022-08-28 DIAGNOSIS — N1831 Chronic kidney disease, stage 3a: Secondary | ICD-10-CM | POA: Diagnosis not present

## 2022-08-28 DIAGNOSIS — I10 Essential (primary) hypertension: Secondary | ICD-10-CM | POA: Diagnosis not present

## 2022-08-28 DIAGNOSIS — R7303 Prediabetes: Secondary | ICD-10-CM | POA: Diagnosis not present

## 2022-08-28 DIAGNOSIS — I679 Cerebrovascular disease, unspecified: Secondary | ICD-10-CM | POA: Diagnosis not present

## 2022-08-28 DIAGNOSIS — E039 Hypothyroidism, unspecified: Secondary | ICD-10-CM | POA: Diagnosis not present

## 2022-08-28 DIAGNOSIS — E782 Mixed hyperlipidemia: Secondary | ICD-10-CM | POA: Diagnosis not present

## 2022-11-07 DIAGNOSIS — Z23 Encounter for immunization: Secondary | ICD-10-CM | POA: Diagnosis not present

## 2023-01-02 ENCOUNTER — Other Ambulatory Visit: Payer: Self-pay | Admitting: Vascular Surgery

## 2023-02-13 DIAGNOSIS — Z6825 Body mass index (BMI) 25.0-25.9, adult: Secondary | ICD-10-CM | POA: Diagnosis not present

## 2023-02-13 DIAGNOSIS — Z7182 Exercise counseling: Secondary | ICD-10-CM | POA: Diagnosis not present

## 2023-02-13 DIAGNOSIS — I10 Essential (primary) hypertension: Secondary | ICD-10-CM | POA: Diagnosis not present

## 2023-02-13 DIAGNOSIS — Z713 Dietary counseling and surveillance: Secondary | ICD-10-CM | POA: Diagnosis not present

## 2023-03-06 DIAGNOSIS — R2689 Other abnormalities of gait and mobility: Secondary | ICD-10-CM | POA: Diagnosis not present

## 2023-03-06 DIAGNOSIS — I10 Essential (primary) hypertension: Secondary | ICD-10-CM | POA: Diagnosis not present

## 2023-03-17 ENCOUNTER — Emergency Department (HOSPITAL_COMMUNITY)
Admission: EM | Admit: 2023-03-17 | Discharge: 2023-03-17 | Disposition: A | Discharge status: Home / Self Care | Attending: Emergency Medicine | Admitting: Emergency Medicine

## 2023-03-17 ENCOUNTER — Encounter (HOSPITAL_COMMUNITY): Payer: Self-pay

## 2023-03-17 ENCOUNTER — Other Ambulatory Visit: Payer: Self-pay

## 2023-03-17 DIAGNOSIS — Z79899 Other long term (current) drug therapy: Secondary | ICD-10-CM | POA: Diagnosis not present

## 2023-03-17 DIAGNOSIS — I160 Hypertensive urgency: Secondary | ICD-10-CM | POA: Diagnosis not present

## 2023-03-17 DIAGNOSIS — R0789 Other chest pain: Secondary | ICD-10-CM | POA: Diagnosis present

## 2023-03-17 DIAGNOSIS — Z8673 Personal history of transient ischemic attack (TIA), and cerebral infarction without residual deficits: Secondary | ICD-10-CM | POA: Insufficient documentation

## 2023-03-17 DIAGNOSIS — I10 Essential (primary) hypertension: Secondary | ICD-10-CM | POA: Diagnosis not present

## 2023-03-17 LAB — TROPONIN I (HIGH SENSITIVITY)
Troponin I (High Sensitivity): 4 ng/L (ref <18)
Troponin I (High Sensitivity): 8 ng/L (ref <18)
Troponin I (High Sensitivity): 9 ng/L (ref <18)

## 2023-03-17 LAB — CBC WITH DIFFERENTIAL/PLATELET
Abs Immature Granulocytes: 0.01 10*3/uL (ref 0.00–0.07)
Basophils Absolute: 0 10*3/uL (ref 0.0–0.1)
Basophils Relative: 1 %
Eosinophils Absolute: 0 10*3/uL (ref 0.0–0.5)
Eosinophils Relative: 1 %
HCT: 42.7 % (ref 36.0–46.0)
Hemoglobin: 13.8 g/dL (ref 12.0–15.0)
Immature Granulocytes: 0 %
Lymphocytes Relative: 21 %
Lymphs Abs: 1.1 10*3/uL (ref 0.7–4.0)
MCH: 30 pg (ref 26.0–34.0)
MCHC: 32.3 g/dL (ref 30.0–36.0)
MCV: 92.8 fL (ref 80.0–100.0)
Monocytes Absolute: 0.3 10*3/uL (ref 0.1–1.0)
Monocytes Relative: 6 %
Neutro Abs: 3.8 10*3/uL (ref 1.7–7.7)
Neutrophils Relative %: 71 %
Platelets: 242 10*3/uL (ref 150–400)
RBC: 4.6 MIL/uL (ref 3.87–5.11)
RDW: 12.4 % (ref 11.5–15.5)
WBC: 5.4 10*3/uL (ref 4.0–10.5)
nRBC: 0 % (ref 0.0–0.2)

## 2023-03-17 LAB — COMPREHENSIVE METABOLIC PANEL
ALT: 23 U/L (ref 0–44)
AST: 24 U/L (ref 15–41)
Albumin: 4.1 g/dL (ref 3.5–5.0)
Alkaline Phosphatase: 76 U/L (ref 38–126)
Anion gap: 12 (ref 5–15)
BUN: 11 mg/dL (ref 8–23)
CO2: 23 mmol/L (ref 22–32)
Calcium: 9.4 mg/dL (ref 8.9–10.3)
Chloride: 105 mmol/L (ref 98–111)
Creatinine, Ser: 0.86 mg/dL (ref 0.44–1.00)
GFR, Estimated: >60 mL/min (ref >60)
Glucose, Bld: 104 mg/dL — ABNORMAL HIGH (ref 70–99)
Potassium: 4.2 mmol/L (ref 3.5–5.1)
Sodium: 140 mmol/L (ref 135–145)
Total Bilirubin: 0.4 mg/dL (ref 0.0–1.2)
Total Protein: 7 g/dL (ref 6.5–8.1)

## 2023-03-17 MED ORDER — NEBIVOLOL HCL 2.5 MG PO TABS
2.5000 mg | ORAL_TABLET | Freq: Once | ORAL | Status: DC
  Filled 2023-03-17: qty 1

## 2023-03-17 MED ORDER — AMLODIPINE BESYLATE 5 MG PO TABS
10.0000 mg | ORAL_TABLET | Freq: Once | ORAL | Status: AC
  Administered 2023-03-17: 10 mg via ORAL
  Filled 2023-03-17: qty 2

## 2023-03-17 NOTE — ED Provider Notes (Signed)
 [place] EMERGENCY DEPARTMENT AT [facility] Provider Note   CSN: [id number] Arrival date & time: 03/17/23  1615     History  Chief Complaint  Patient presents with   Hypertension    Sarah Nelson is a 88 y.o. female.  HPI 88 year old female presents with elevated blood pressure.  Her blood pressure has been difficult to control at home.  She was originally put on meds and then a few weeks ago had some her meds reduced and then increased back again due to worsening blood pressures.  She has been monitoring at home but today after waking up around 9 AM she noticed her blood pressure was more elevated than typical and ultimately went to about 215 systolic.  She had been having some dizziness chronically due to a stroke several years ago but that seems to be a little worse over the last few weeks.  She feels like her vision is slightly off today and a blurry fashion bilaterally.  She denies headache, focal weakness.  Daughter is concerned because these are similar symptoms to when she first was diagnosed with a stroke several years ago.  Her chest has been tight over the last several days but she thinks this might be anxiety from her blood pressure.  Home Medications Prior to Admission medications   Medication Sig Start Date End Date Taking? Authorizing Provider  acetaminophen (TYLENOL) 500 MG tablet Take 500 mg by mouth every 6 (six) hours as needed (pain.).    [provider]  amLODipine (NORVASC) 10 MG tablet Take 5 mg by mouth at bedtime.  05/20/17   [provider]  calcium-vitamin D (OSCAL WITH D) 500-5 MG-MCG tablet Take 1 tablet by mouth.    [provider]  clopidogrel (PLAVIX) 75 MG tablet TAKE 1 TABLET BY MOUTH EVERY DAY 01/02/23   Maeola Harman, MD  docusate sodium (COLACE) 100 MG capsule Take 100 mg by mouth at bedtime.    [provider]  levothyroxine (SYNTHROID) 125 MCG tablet Take 112 mcg by mouth daily before  breakfast.  04/25/19   [provider]  losartan (COZAAR) 50 MG tablet Take 50 mg by mouth daily.    [provider]  OVER THE COUNTER MEDICATION Take 1 tablet by mouth 2 (two) times daily. AREDS    [provider]  rosuvastatin (CRESTOR) 10 MG tablet Take 10 mg by mouth daily.  05/10/19   [provider]  vitamin B-12 (CYANOCOBALAMIN) 100 MCG tablet Take 100 mcg by mouth daily.    [provider]      Allergies    Patient has no known allergies.    Review of Systems   Review of Systems  Constitutional:  Negative for fever.  Eyes:  Positive for visual disturbance.  Respiratory:  Positive for chest tightness.   Cardiovascular:  Negative for chest pain.  Neurological:  Positive for dizziness and light-headedness. Negative for weakness, numbness and headaches.    Physical Exam Updated Vital Signs BP (!) 174/76   Pulse 60   Temp 98.3 F (36.8 C) (Temporal)   Resp 15   Ht 5' 6.5" (1.689 m)   Wt 68.9 kg   SpO2 95%   BMI 24.15 kg/m  Physical Exam Vitals and nursing note reviewed.  Constitutional:      General: She is not in acute distress.    Appearance: She is well-developed. She is not ill-appearing or diaphoretic.  HENT:     Head: Normocephalic and atraumatic.  Eyes:     Extraocular Movements: Extraocular movements intact.     Pupils: Pupils are equal, round, and reactive to light.  Cardiovascular:     Rate and Rhythm: Normal rate and regular rhythm.     Heart sounds: Normal heart sounds.  Pulmonary:     Effort: Pulmonary effort is normal.     Breath sounds: Normal breath sounds.  Abdominal:     Palpations: Abdomen is soft.     Tenderness: There is no abdominal tenderness.  Skin:    General: Skin is warm and dry.  Neurological:     Mental Status: She is alert.     Comments: CN 3-12 grossly intact. 5/5 strength in all 4 extremities. Grossly normal sensation. Normal finger to nose. Is able to walk but feels a little unsteady,  but states this is her normal baseline since the stroke.      ED Results / Procedures / Treatments   Labs (all labs ordered are listed, but only abnormal results are displayed) Labs Reviewed  COMPREHENSIVE METABOLIC PANEL - Abnormal; Notable for the following components:      Result Value   Glucose, Bld 104 (*)    All other components within normal limits  CBC WITH DIFFERENTIAL/PLATELET  TROPONIN I (HIGH SENSITIVITY)  TROPONIN I (HIGH SENSITIVITY)  TROPONIN I (HIGH SENSITIVITY)    EKG EKG Interpretation Date/Time:  Monday March 17 2023 19:30:01 EDT Ventricular Rate:  58 PR Interval:  147 QRS Duration:  94 QT Interval:  422 QTC Calculation: 415 R Axis:   65  Text Interpretation: Sinus rhythm nonspecific changes, overall improved since 2019 Confirmed by Pricilla Loveless [phone]) on 03/17/2023 7:53:52 PM  Radiology No results found.  Procedures Procedures    Medications Ordered in ED Medications  amLODipine (NORVASC) tablet 10 mg (10 mg Oral Given 03/17/23 1935)    ED Course/ Medical Decision Making/ A&P                                 Medical Decision Making Amount and/or Complexity of Data Reviewed Labs: ordered.    Details: Troponins negative.  Unremarkable kidney function Radiology: ordered. ECG/medicine tests: ordered and independent interpretation performed.    Details: No ischemia  Risk Prescription drug management.   Patient presents with hypertension.  Does not appear to truly be symptomatic though does have some chronic neurosymptoms from her previous stroke.  Family however was concerned about the possibility of stroke given her previous history and so an MRI had been ordered but unfortunately the MRI techs left early tonight and we are unable to perform the study tonight.  Discussed this with again with the patient and she would like to hold off on any CT imaging and will talk to her doctor about possibly getting an outpatient MRI.  She is actually had  the symptoms for a week so I think this is reasonable.  From a blood pressure standpoint, her blood pressure has waxed and waned but she has been asymptomatic.  Gave her an increased dose of her amlodipine and try to give her Bystolic but we did not carry this medication here.  Ultimately her blood pressure still has improved significantly from her highest value in the emergency department and I think she is stable for discharge and she prefers discharge.  She will call her doctor tomorrow.        Final Clinical Impression(s) / ED Diagnoses Final diagnoses:  Hypertensive urgency    Rx / DC Orders ED Discharge Orders     None         Pricilla Loveless, MD 03/17/23 2357

## 2023-03-17 NOTE — Discharge Instructions (Signed)
 If you develop headache, chest pain, shortness of breath, weakness or numbness in your arms or legs, or any other new/concerning symptoms then return to the ER or call 911.  We gave you a dose of Norvasc/amlodipine tonight but did not give you your Bystolic.  Otherwise take your medicines as prescribed by your doctor.  Call your doctor in the morning.

## 2023-03-17 NOTE — ED Triage Notes (Signed)
 Pt arrived via POV from home c/o hypertension 219/94 at home today. Pt endorses chest tightness, lightheaded and dizzy.

## 2023-03-18 DIAGNOSIS — I10 Essential (primary) hypertension: Secondary | ICD-10-CM | POA: Diagnosis not present

## 2023-03-18 DIAGNOSIS — R300 Dysuria: Secondary | ICD-10-CM | POA: Diagnosis not present

## 2023-03-24 DIAGNOSIS — I69398 Other sequelae of cerebral infarction: Secondary | ICD-10-CM | POA: Diagnosis not present

## 2023-03-24 DIAGNOSIS — Z6825 Body mass index (BMI) 25.0-25.9, adult: Secondary | ICD-10-CM | POA: Diagnosis not present

## 2023-03-24 DIAGNOSIS — Z713 Dietary counseling and surveillance: Secondary | ICD-10-CM | POA: Diagnosis not present

## 2023-03-24 DIAGNOSIS — I10 Essential (primary) hypertension: Secondary | ICD-10-CM | POA: Diagnosis not present

## 2023-03-24 DIAGNOSIS — R2689 Other abnormalities of gait and mobility: Secondary | ICD-10-CM | POA: Diagnosis not present

## 2023-04-14 DIAGNOSIS — R600 Localized edema: Secondary | ICD-10-CM | POA: Diagnosis not present

## 2023-04-14 DIAGNOSIS — E039 Hypothyroidism, unspecified: Secondary | ICD-10-CM | POA: Diagnosis not present

## 2023-04-14 DIAGNOSIS — I679 Cerebrovascular disease, unspecified: Secondary | ICD-10-CM | POA: Diagnosis not present

## 2023-04-14 DIAGNOSIS — R7303 Prediabetes: Secondary | ICD-10-CM | POA: Diagnosis not present

## 2023-04-14 DIAGNOSIS — E559 Vitamin D deficiency, unspecified: Secondary | ICD-10-CM | POA: Diagnosis not present

## 2023-04-14 DIAGNOSIS — I10 Essential (primary) hypertension: Secondary | ICD-10-CM | POA: Diagnosis not present

## 2023-04-14 DIAGNOSIS — I129 Hypertensive chronic kidney disease with stage 1 through stage 4 chronic kidney disease, or unspecified chronic kidney disease: Secondary | ICD-10-CM | POA: Diagnosis not present

## 2023-04-14 DIAGNOSIS — N1831 Chronic kidney disease, stage 3a: Secondary | ICD-10-CM | POA: Diagnosis not present

## 2023-04-14 DIAGNOSIS — K219 Gastro-esophageal reflux disease without esophagitis: Secondary | ICD-10-CM | POA: Diagnosis not present

## 2023-04-14 DIAGNOSIS — R5383 Other fatigue: Secondary | ICD-10-CM | POA: Diagnosis not present

## 2023-04-14 DIAGNOSIS — E782 Mixed hyperlipidemia: Secondary | ICD-10-CM | POA: Diagnosis not present

## 2023-04-14 DIAGNOSIS — R2689 Other abnormalities of gait and mobility: Secondary | ICD-10-CM | POA: Diagnosis not present

## 2023-05-09 DIAGNOSIS — E039 Hypothyroidism, unspecified: Secondary | ICD-10-CM | POA: Diagnosis not present

## 2023-05-14 DIAGNOSIS — I10 Essential (primary) hypertension: Secondary | ICD-10-CM | POA: Diagnosis not present

## 2023-05-14 DIAGNOSIS — Z79890 Hormone replacement therapy (postmenopausal): Secondary | ICD-10-CM | POA: Diagnosis not present

## 2023-05-14 DIAGNOSIS — E039 Hypothyroidism, unspecified: Secondary | ICD-10-CM | POA: Diagnosis not present

## 2023-05-14 DIAGNOSIS — F418 Other specified anxiety disorders: Secondary | ICD-10-CM | POA: Diagnosis not present

## 2023-05-14 DIAGNOSIS — Z79899 Other long term (current) drug therapy: Secondary | ICD-10-CM | POA: Diagnosis not present

## 2023-05-14 DIAGNOSIS — Z713 Dietary counseling and surveillance: Secondary | ICD-10-CM | POA: Diagnosis not present

## 2023-05-14 DIAGNOSIS — Z6825 Body mass index (BMI) 25.0-25.9, adult: Secondary | ICD-10-CM | POA: Diagnosis not present

## 2023-06-09 ENCOUNTER — Other Ambulatory Visit: Payer: Self-pay

## 2023-06-09 DIAGNOSIS — I6523 Occlusion and stenosis of bilateral carotid arteries: Secondary | ICD-10-CM

## 2023-06-25 ENCOUNTER — Ambulatory Visit: Admitting: Physician Assistant

## 2023-06-25 ENCOUNTER — Ambulatory Visit (HOSPITAL_COMMUNITY)
Admission: RE | Admit: 2023-06-25 | Discharge: 2023-06-25 | Disposition: A | Discharge status: Home / Self Care | Source: Ambulatory Visit | Attending: Vascular Surgery | Admitting: Vascular Surgery

## 2023-06-25 DIAGNOSIS — I6523 Occlusion and stenosis of bilateral carotid arteries: Secondary | ICD-10-CM

## 2023-06-25 NOTE — Progress Notes (Signed)
 HISTORY AND PHYSICAL     CC:  follow up. Requesting Provider:  Omie Bickers, MD  HPI: This is a 88 y.o. female here for follow up for carotid artery stenosis.  Pt is s/p right TCAR for asymptomatic carotid artery stenosis on 06/09/2019 by Dr. Vikki Graves.   She has hx of left MCA stroke not related to her carotid disease and during workup she was found to have right ICA stenosis.    Pt was last seen 05/22/2022 and at that time she was not having any stroke sx but was having some balance issues and lightheadedness.    Pt returns today for follow up.    Pt denies any amaurosis fugax, speech difficulties, weakness, numbness, paralysis or clumsiness or facial droop.   She continues to have some balance issues that she attributes to her stroke.  She feels the wooziness she feels could be due to the multiple medications she is on.  She states that her blood pressure earlier this year was quite elevated, but has been much improved and this morning was 120 systolic. This is followed by her PCP Dr. Del Favia.    She does not have any pain in her legs or feet or non healing wounds.  She does state she has some tingling in her feet that she attributes to neuropathy.    She has been married to her husband for 62 years.  Her husband is a pt of Dr. Vikki Graves and had a stent graft for AAA and emergent surgery after cath for bleeding.   The pt is on a statin for cholesterol management.  The pt is not on a daily aspirin .   Other AC:  Plavix  The pt is on ARB for hypertension.   The pt is not on medication for diabetes Tobacco hx:  never  Pt does not have family hx of AAA.  Past Medical History:  Diagnosis Date   Arthritis    Carotid artery occlusion    COVID-19 02/09/2019   High cholesterol    Hypertension    Stroke (HCC) 05/2017   Thyroid disease     Past Surgical History:  Procedure Laterality Date   APPENDECTOMY     cataracts  2018   removed   COLONOSCOPY     LUMBAR FUSION  11/04/2018   MENISCUS REPAIR  Right    ROTATOR CUFF REPAIR Right    TRANSCAROTID ARTERY REVASCULARIZATION  Right 06/09/2019   Procedure: TRANSCAROTID ARTERY REVASCULARIZATION;  Surgeon: Adine Hoof, MD;  Location: [facility] OR;  Service: Vascular;  Laterality: Right;   ULTRASOUND GUIDANCE FOR VASCULAR ACCESS Left 06/09/2019   Procedure: Ultrasound Guidance For Vascular Access;  Surgeon: Adine Hoof, MD;  Location: [facility] OR;  Service: Vascular;  Laterality: Left;    No Known Allergies  Current Outpatient Medications  Medication Sig Dispense Refill   acetaminophen  (TYLENOL ) 500 MG tablet Take 500 mg by mouth every 6 (six) hours as needed (pain.).     amLODipine  (NORVASC ) 10 MG tablet Take 5 mg by mouth at bedtime.   5   calcium -vitamin D (OSCAL WITH D) 500-5 MG-MCG tablet Take 1 tablet by mouth.     clopidogrel  (PLAVIX ) 75 MG tablet TAKE 1 TABLET BY MOUTH EVERY DAY 90 tablet 3   docusate sodium  (COLACE) 100 MG capsule Take 100 mg by mouth at bedtime.     levothyroxine  (SYNTHROID ) 125 MCG tablet Take 112 mcg by mouth daily before breakfast.      losartan (COZAAR) 50 MG tablet  Take 50 mg by mouth daily.     OVER THE COUNTER MEDICATION Take 1 tablet by mouth 2 (two) times daily. AREDS     rosuvastatin  (CRESTOR ) 10 MG tablet Take 10 mg by mouth daily.      vitamin B-12 (CYANOCOBALAMIN) 100 MCG tablet Take 100 mcg by mouth daily.     No current facility-administered medications for this visit.    Family History  Problem Relation Age of Onset   Stroke Mother    Hypertension Mother    Cancer Father    Cancer Brother        brain tumor   Hypertension Paternal Grandmother    Diabetes Paternal Grandfather     Social History   Socioeconomic History   Marital status: Married    Spouse name: Not on file   Number of children: Not on file   Years of education: Not on file   Highest education level: Not on file  Occupational History   Not on file  Tobacco Use   Smoking status: Never   Smokeless  tobacco: Never  Vaping Use   Vaping status: Never Used  Substance and Sexual Activity   Alcohol use: Never   Drug use: Never   Sexual activity: Not on file  Other Topics Concern   Not on file  Social History Narrative   Lives with husband Hinton Luis.  Is retired.  Education 12th grade.  Children 3.     Social Drivers of Corporate investment banker Strain: Not on file  Food Insecurity: Not on file  Transportation Needs: Not on file  Physical Activity: Not on file  Stress: Not on file  Social Connections: Not on file  Intimate Partner Violence: Not on file     REVIEW OF SYSTEMS:   [X]  denotes positive finding, [ ]  denotes negative finding Cardiac  Comments:  Chest pain or chest pressure:    Shortness of breath upon exertion:    Short of breath when lying flat:    Irregular heart rhythm:        Vascular    Pain in calf, thigh, or hip brought on by ambulation:    Pain in feet at night that wakes you up from your sleep:     Blood clot in your veins:    Leg swelling:         Pulmonary    Oxygen  at home:    Productive cough:     Wheezing:         Neurologic    Sudden weakness in arms or legs:     Sudden numbness in arms or legs:     Sudden onset of difficulty speaking or slurred speech:    Temporary loss of vision in one eye:     Problems with dizziness:     Balance issues x   Gastrointestinal    Blood in stool:     Vomited blood:         Genitourinary    Burning when urinating:     Blood in urine:        Psychiatric    Major depression:         Hematologic    Bleeding problems:    Problems with blood clotting too easily:        Skin    Rashes or ulcers:        Constitutional    Fever or chills:      PHYSICAL EXAMINATION:   General:  WDWN  in NAD; vital signs documented above Gait: Not observed HENT: WNL, normocephalic Pulmonary: normal non-labored breathing Cardiac: regular HR, without carotid bruits Abdomen: soft, NT; aortic pulse is not  palpable Skin: well healed right chest incision Vascular Exam/Pulses:  Right Left  Radial 2+ (normal) 2+ (normal)  DP 2+ (normal) 2+ (normal)   Extremities: without open wounds Musculoskeletal: no muscle wasting or atrophy  Neurologic: A&O X 3; moving all extremities equally; speech is fluent/normal Psychiatric:  The pt has Normal affect.   Non-Invasive Vascular Imaging:   Carotid Duplex on 06/25/2023 Right:  patent ICA without hemodynamically stenosis Left:  1-39% ICA stenosis Vertebrals:  Bilateral vertebral arteries demonstrate antegrade flow.  Subclavians: Normal flow hemodynamics were seen in bilateral subclavian arteries   Previous Carotid duplex on 05/22/2022: Right: patent right stent without stenosis Left:   1-39% ICA stenosis    ASSESSMENT/PLAN:: 88 y.o. female here for follow up carotid artery stenosis and has hx of right TCAR for asymptomatic carotid artery stenosis on 06/09/2019 by Dr. Vikki Graves.   She has hx of left MCA stroke not related to her carotid disease and during workup she was found to have right ICA stenosis.    -duplex today reveals patent right ICA stent without stenosis and the left is 1-39% ICA stenosis.  She remains asymptomatic.   -discussed s/s of stroke with pt and she understands should she develop any of these sx, she will go to the nearest ER or call 911. -pt will f/u in one year with carotid duplex-we will get this scheduled in the [place] office.   -pt will call sooner should she have any issues. -continue statin/plavix    Maryanna Smart, [facility] [phone]  Clinic MD:  Vikki Graves

## 2023-07-08 DIAGNOSIS — B078 Other viral warts: Secondary | ICD-10-CM | POA: Diagnosis not present

## 2023-07-08 DIAGNOSIS — L821 Other seborrheic keratosis: Secondary | ICD-10-CM | POA: Diagnosis not present

## 2023-07-08 DIAGNOSIS — D1801 Hemangioma of skin and subcutaneous tissue: Secondary | ICD-10-CM | POA: Diagnosis not present

## 2023-07-08 DIAGNOSIS — L565 Disseminated superficial actinic porokeratosis (DSAP): Secondary | ICD-10-CM | POA: Diagnosis not present

## 2023-07-08 DIAGNOSIS — L570 Actinic keratosis: Secondary | ICD-10-CM | POA: Diagnosis not present

## 2023-07-08 DIAGNOSIS — L720 Epidermal cyst: Secondary | ICD-10-CM | POA: Diagnosis not present

## 2023-07-17 DIAGNOSIS — H524 Presbyopia: Secondary | ICD-10-CM | POA: Diagnosis not present

## 2023-07-17 DIAGNOSIS — H353132 Nonexudative age-related macular degeneration, bilateral, intermediate dry stage: Secondary | ICD-10-CM | POA: Diagnosis not present

## 2023-07-17 DIAGNOSIS — H02831 Dermatochalasis of right upper eyelid: Secondary | ICD-10-CM | POA: Diagnosis not present

## 2023-07-17 DIAGNOSIS — H02834 Dermatochalasis of left upper eyelid: Secondary | ICD-10-CM | POA: Diagnosis not present

## 2023-07-17 DIAGNOSIS — H18593 Other hereditary corneal dystrophies, bilateral: Secondary | ICD-10-CM | POA: Diagnosis not present

## 2023-07-17 DIAGNOSIS — H52203 Unspecified astigmatism, bilateral: Secondary | ICD-10-CM | POA: Diagnosis not present

## 2023-09-25 ENCOUNTER — Encounter (INDEPENDENT_AMBULATORY_CARE_PROVIDER_SITE_OTHER): Payer: Self-pay | Admitting: *Deleted

## 2023-10-09 ENCOUNTER — Encounter (INDEPENDENT_AMBULATORY_CARE_PROVIDER_SITE_OTHER): Payer: Self-pay | Admitting: Gastroenterology

## 2023-10-09 ENCOUNTER — Ambulatory Visit (INDEPENDENT_AMBULATORY_CARE_PROVIDER_SITE_OTHER): Admitting: Gastroenterology

## 2023-10-09 ENCOUNTER — Telehealth: Payer: Self-pay | Admitting: *Deleted

## 2023-10-09 VITALS — BP 158/77 | HR 64 | Temp 97.8°F | Ht 66.5 in | Wt 152.5 lb

## 2023-10-09 DIAGNOSIS — R152 Fecal urgency: Secondary | ICD-10-CM | POA: Diagnosis not present

## 2023-10-09 DIAGNOSIS — R1310 Dysphagia, unspecified: Secondary | ICD-10-CM

## 2023-10-09 DIAGNOSIS — K219 Gastro-esophageal reflux disease without esophagitis: Secondary | ICD-10-CM | POA: Insufficient documentation

## 2023-10-09 MED ORDER — PANTOPRAZOLE SODIUM 40 MG PO TBEC: 40.0000 mg | DELAYED_RELEASE_TABLET | Freq: Every day | ORAL | 3 refills | Status: AC

## 2023-10-09 NOTE — Telephone Encounter (Signed)
 Request to stop Plavix   What type of surgery is being performed? COLONOSCOPY  When is surgery scheduled? TBD  What type of clearance is required (medical or pharmacy to hold medication or both? MEDICATION  Are there any medications that need to be held prior to surgery and how long? PLAVIX  X 5 DAYS PRIOR  Name of physician performing surgery?  Dr. Eartha Rouse [facility]astroenterology at [facility] Phone: [phone] Fax: [phone]  Anethesia type (none, local, MAC, general)? MAC     ? Yes ? No Patient can hold medication as requested   Signature: ___________________________

## 2023-10-09 NOTE — Patient Instructions (Addendum)
 Schedule colonoscopy Perform stool workup Stop Metamucil Start pantoprazole  40 mg qday

## 2023-10-09 NOTE — Progress Notes (Signed)
 Toribio Fortune, M.D. Gastroenterology & Hepatology [facility] Gastroenterology [address] [place], [place] [address] Primary Care Physician: Shona Norleen PEDLAR, MD [address] [place] [address]  Referring MD: PCP  Chief Complaint:  fecal urgency and dysphagia.  History of Present Illness: Sarah Nelson is a 88 y.o. female with history of hyperlipidemia, hypertension, stroke, who presents for evaluation of fecal urgency and dysphagia.  Patient reports that for the last 5-6 months she has had intermittent episodes of lower abdominal cramping which is followed by fecal urgency. She reports that she has not presented any incontinence but has had issues with fecal soiling as she has not been able to make it to restroom in time. She takes Metamucil with her coffee for several years. Sometimes her stools can be watery. She is afraid of going out as she has had issues with fecal urgency.  Patient reports that she has had some issues with dysphagia for the last 6-12 months - this happens intermittently with solids, sometimes with water. Says her lower chest may hurt when she swallows.Denies any nausea and vomiting.  States that she has regurgitation and throat burning during the nights. Takes an OTC antacid every night, which may somewhat help.  The patient denies having any nausea, vomiting, fever, chills, hematochezia, melena, hematemesis, abdominal distention, abdominal pain,  jaundice, pruritus or weight loss.  Last ZHI:wzczm Last Colonoscopy:>15 years ago, states this was normal, no report available  FHx: neg for any gastrointestinal/liver disease, no malignancies Social: neg smoking, alcohol or illicit drug use Surgical: appendectomy  Past Medical History: Past Medical History:  Diagnosis Date   Arthritis    Carotid artery occlusion    COVID-19 02/09/2019   High cholesterol    Hypertension    Stroke (HCC) 05/2017   Thyroid  disease      Past Surgical History: Past Surgical History:  Procedure Laterality Date   APPENDECTOMY     cataracts  2018   removed   COLONOSCOPY     LUMBAR FUSION  11/04/2018   MENISCUS REPAIR Right    ROTATOR CUFF REPAIR Right    TRANSCAROTID ARTERY REVASCULARIZATION  Right 06/09/2019   Procedure: TRANSCAROTID ARTERY REVASCULARIZATION;  Surgeon: Sheree Penne Bruckner, MD;  Location: [facility] OR;  Service: Vascular;  Laterality: Right;   ULTRASOUND GUIDANCE FOR VASCULAR ACCESS Left 06/09/2019   Procedure: Ultrasound Guidance For Vascular Access;  Surgeon: Sheree Penne Bruckner, MD;  Location: [facility] OR;  Service: Vascular;  Laterality: Left;    Family History: Family History  Problem Relation Age of Onset   Stroke Mother    Hypertension Mother    Cancer Father    Cancer Brother        brain tumor   Hypertension Paternal Grandmother    Diabetes Paternal Grandfather     Social History: Social History   Tobacco Use  Smoking Status Never  Smokeless Tobacco Never   Social History   Substance and Sexual Activity  Alcohol Use Never   Social History   Substance and Sexual Activity  Drug Use Never    Allergies: No Known Allergies  Medications: Current Outpatient Medications  Medication Sig Dispense Refill   amLODipine  (NORVASC ) 10 MG tablet Take 5 mg by mouth at bedtime.  (Patient taking differently: Take 10 mg by mouth daily.)  5   busPIRone (BUSPAR) 5 MG tablet Take 5 mg by mouth 2 (two) times daily.     cloNIDine (CATAPRES) 0.1 MG tablet Take 0.1 mg  by mouth as needed.     clopidogrel  (PLAVIX ) 75 MG tablet TAKE 1 TABLET BY MOUTH EVERY DAY 90 tablet 3   docusate sodium  (COLACE) 100 MG capsule Take 100 mg by mouth at bedtime.     levothyroxine  (SYNTHROID ) 125 MCG tablet Take 112 mcg by mouth daily before breakfast.  (Patient taking differently: Take 112 mcg by mouth daily before breakfast. 112 mcg four days per week, and 125 mcg three days out of the week.)     nebivolol   (BYSTOLIC ) 5 MG tablet Take 5 mg by mouth daily.     OVER THE COUNTER MEDICATION Take by mouth. AREDS one po bid  Vit D 3 (50 mcg daily)     psyllium (METAMUCIL) 58.6 % packet Take 1 packet by mouth daily.     rosuvastatin  (CRESTOR ) 10 MG tablet Take 10 mg by mouth daily.      telmisartan (MICARDIS) 80 MG tablet Take 80 mg by mouth daily.     vitamin B-12 (CYANOCOBALAMIN) 100 MCG tablet Take 100 mcg by mouth daily. (Patient taking differently: Take 1,000 mcg by mouth daily.)     No current facility-administered medications for this visit.    Review of Systems: GENERAL: negative for malaise, night sweats HEENT: No changes in hearing or vision, no nose bleeds or other nasal problems. NECK: Negative for lumps, goiter, pain and significant neck swelling RESPIRATORY: Negative for cough, wheezing CARDIOVASCULAR: Negative for chest pain, leg swelling, palpitations, orthopnea GI: SEE HPI MUSCULOSKELETAL: Negative for joint pain or swelling, back pain, and muscle pain. SKIN: Negative for lesions, rash PSYCH: Negative for sleep disturbance, mood disorder and recent psychosocial stressors. HEMATOLOGY Negative for prolonged bleeding, bruising easily, and swollen nodes. ENDOCRINE: Negative for cold or heat intolerance, polyuria, polydipsia and goiter. NEURO: negative for tremor, gait imbalance, syncope and seizures. The remainder of the review of systems is noncontributory.   Physical Exam: BP (!) 158/77 (BP Location: Left Arm, Patient Position: Sitting, Cuff Size: Normal)   Pulse 64   Temp 97.8 F (36.6 C) (Temporal)   Ht 5' 6.5 (1.689 m)   Wt 152 lb 8 oz (69.2 kg)   BMI 24.25 kg/m  GENERAL: The patient is AO x3, in no acute distress. HEENT: Head is normocephalic and atraumatic. EOMI are intact. Mouth is well hydrated and without lesions. NECK: Supple. No masses LUNGS: Clear to auscultation. No presence of rhonchi/wheezing/rales. Adequate chest expansion HEART: RRR, normal s1 and  s2. ABDOMEN: Soft, nontender, no guarding, no peritoneal signs, and nondistended. BS +. No masses. EXTREMITIES: Without any cyanosis, clubbing, rash, lesions or edema. NEUROLOGIC: AOx3, no focal motor deficit. SKIN: no jaundice, no rashes   Imaging/Labs: as above  I personally reviewed and interpreted the available labs, imaging and endoscopic files.  Impression and Plan: Sarah Nelson is a 88 y.o. female with history of hyperlipidemia, hypertension, stroke, who presents for evaluation of fecal urgency and dysphagia. Patient has presented recurrent episodes of fecal urgency of unclear etiology.  She is not having fecal incontinence as she is able to tell when stool is, but the fecal urgency is affecting her ability to hold her bowels.  Has not presented any other red flag signs.  We will rule out chronic gastrointestinal infections leading to her symptoms, but we will also proceed with a colonoscopy to obtain random colonic biopsies (will need to obtain clearance to hold Plavix  for 5 days, okay to take aspirin  in the meantime).  I also advised her to stop taking any  bulking agents or laxatives for now.  She has presented some issues with symptoms suggestive of GERD and some dysphagia.  She will be prescribed pantoprazole  40 mg every day.  I discussed the possibility of evaluating her symptoms further with an EGD but she would like to hold off on this for now.  -Schedule colonoscopy - Check GI pathogen panel and ova and parasite in stool -Stop Metamucil -Start pantoprazole  40 mg qday  All questions were answered.      Toribio Fortune, MD Gastroenterology and Hepatology [facility]

## 2023-10-10 NOTE — Telephone Encounter (Signed)
 LMOVM to call back

## 2023-10-10 NOTE — Telephone Encounter (Signed)
 Thanks, please ask her to hold Plavix  for 5 days - can take baby aspirin  81 mg during those days. Thanks

## 2023-10-13 ENCOUNTER — Encounter (INDEPENDENT_AMBULATORY_CARE_PROVIDER_SITE_OTHER): Payer: Self-pay | Admitting: *Deleted

## 2023-10-13 ENCOUNTER — Other Ambulatory Visit (INDEPENDENT_AMBULATORY_CARE_PROVIDER_SITE_OTHER): Payer: Self-pay | Admitting: *Deleted

## 2023-10-13 MED ORDER — PEG 3350-KCL-NA BICARB-NACL 420 G PO SOLR: 4000.0000 mL | Freq: Once | ORAL | 0 refills | Status: AC

## 2023-10-13 NOTE — Telephone Encounter (Signed)
 Pt has been scheduled for 11/25/23. Instructions  mailed and prep sent to pharmacy.

## 2023-10-14 DIAGNOSIS — L570 Actinic keratosis: Secondary | ICD-10-CM | POA: Diagnosis not present

## 2023-10-14 DIAGNOSIS — L720 Epidermal cyst: Secondary | ICD-10-CM | POA: Diagnosis not present

## 2023-10-14 DIAGNOSIS — L821 Other seborrheic keratosis: Secondary | ICD-10-CM | POA: Diagnosis not present

## 2023-10-15 ENCOUNTER — Ambulatory Visit (INDEPENDENT_AMBULATORY_CARE_PROVIDER_SITE_OTHER): Payer: Self-pay | Admitting: Gastroenterology

## 2023-10-15 LAB — GASTROINTESTINAL PATHOGEN PNL
CampyloBacter Group: NOT DETECTED
Norovirus GI/GII: NOT DETECTED
Rotavirus A: NOT DETECTED
Salmonella species: NOT DETECTED
Shiga Toxin 1: NOT DETECTED
Shiga Toxin 2: NOT DETECTED
Shigella Species: NOT DETECTED
Vibrio Group: NOT DETECTED
Yersinia enterocolitica: NOT DETECTED

## 2023-10-15 LAB — OVA AND PARASITE EXAMINATION
CONCENTRATE RESULT:: NONE SEEN
MICRO NUMBER:: 17062085
SPECIMEN QUALITY:: ADEQUATE
TRICHROME RESULT:: NONE SEEN

## 2023-10-22 ENCOUNTER — Encounter (INDEPENDENT_AMBULATORY_CARE_PROVIDER_SITE_OTHER): Payer: Self-pay | Admitting: Gastroenterology

## 2023-11-10 DIAGNOSIS — R7303 Prediabetes: Secondary | ICD-10-CM | POA: Diagnosis not present

## 2023-11-10 DIAGNOSIS — R5383 Other fatigue: Secondary | ICD-10-CM | POA: Diagnosis not present

## 2023-11-14 DIAGNOSIS — F418 Other specified anxiety disorders: Secondary | ICD-10-CM | POA: Diagnosis not present

## 2023-11-14 DIAGNOSIS — Z0000 Encounter for general adult medical examination without abnormal findings: Secondary | ICD-10-CM | POA: Diagnosis not present

## 2023-11-14 DIAGNOSIS — I10 Essential (primary) hypertension: Secondary | ICD-10-CM | POA: Diagnosis not present

## 2023-11-14 DIAGNOSIS — E039 Hypothyroidism, unspecified: Secondary | ICD-10-CM | POA: Diagnosis not present

## 2023-11-14 DIAGNOSIS — Z79899 Other long term (current) drug therapy: Secondary | ICD-10-CM | POA: Diagnosis not present

## 2023-11-14 DIAGNOSIS — Z79890 Hormone replacement therapy (postmenopausal): Secondary | ICD-10-CM | POA: Diagnosis not present

## 2023-11-14 DIAGNOSIS — Z0001 Encounter for general adult medical examination with abnormal findings: Secondary | ICD-10-CM | POA: Diagnosis not present

## 2023-11-20 ENCOUNTER — Other Ambulatory Visit: Payer: Self-pay

## 2023-11-20 ENCOUNTER — Encounter (HOSPITAL_COMMUNITY): Payer: Self-pay

## 2023-11-20 ENCOUNTER — Encounter (HOSPITAL_COMMUNITY)
Admission: RE | Admit: 2023-11-20 | Discharge: 2023-11-20 | Disposition: A | Discharge status: Home / Self Care | Source: Ambulatory Visit | Attending: Gastroenterology | Admitting: Gastroenterology

## 2023-11-20 HISTORY — DX: Hypothyroidism, unspecified: E03.9

## 2023-11-20 HISTORY — DX: Gastro-esophageal reflux disease without esophagitis: K21.9

## 2023-11-20 HISTORY — DX: Anxiety disorder, unspecified: F41.9

## 2023-11-24 NOTE — Anesthesia Preprocedure Evaluation (Signed)
 Anesthesia Evaluation  Patient identified by MRN, date of birth, ID band Patient awake    Reviewed: Allergy & Precautions, NPO status , Patient's Chart, lab work & pertinent test results  Airway Mallampati: I  TM Distance: >3 FB Neck ROM: Full    Dental no notable dental hx. (+) Teeth Intact, Dental Advisory Given   Pulmonary  COVID + in Feb 2021, never really symptomatic   Pulmonary exam normal breath sounds clear to auscultation       Cardiovascular hypertension, Pt. on medications +CHF  Normal cardiovascular exam Rhythm:Regular Rate:Normal  Last echo 05/2017: - Left ventricle: The cavity size was normal. Wall thickness was  increased in a pattern of mild LVH. Systolic function was  vigorous. The estimated ejection fraction was in the range of 65%  to 70%. Wall motion was normal; there were no regional wall  motion abnormalities. Features are consistent with a pseudonormal  left ventricular filling pattern, with concomitant abnormal  relaxation and increased filling pressure (grade 2 diastolic  dysfunction). Indeterminate filling pressures.  - Aortic valve: Trileaflet; mildly thickened, mildly calcified  leaflets. There was no stenosis.  - Left atrium: The atrium was mildly dilated.  - Atrial septum: There was increased thickness of the septum,  consistent with lipomatous hypertrophy. No defect or patent  foramen ovale was identified.    Neuro/Psych   Anxiety     high-grade right ICA stenosis that is asymptomatic  On plavix /ASA  CVA 2 years ago, some balance issues but had this prior to CVA  CVA, No Residual Symptoms  negative psych ROS   GI/Hepatic Neg liver ROS,GERD  ,,  Endo/Other  Hypothyroidism    Renal/GU negative Renal ROS  negative genitourinary   Musculoskeletal  (+) Arthritis , Osteoarthritis,    Abdominal Normal abdominal exam  (+)   Peds negative pediatric ROS (+)   Hematology negative hematology ROS (+)   Anesthesia Other Findings   Reproductive/Obstetrics negative OB ROS                              Anesthesia Physical Anesthesia Plan  ASA: 3  Anesthesia Plan: General   Post-op Pain Management: Minimal or no pain anticipated   Induction: Intravenous  PONV Risk Score and Plan: Propofol  infusion  Airway Management Planned: Nasal Cannula and Natural Airway  Additional Equipment: None  Intra-op Plan:   Post-operative Plan:   Informed Consent: I have reviewed the patients History and Physical, chart, labs and discussed the procedure including the risks, benefits and alternatives for the proposed anesthesia with the patient or authorized representative who has indicated his/her understanding and acceptance.     Dental advisory given  Plan Discussed with: CRNA  Anesthesia Plan Comments:          Anesthesia Quick Evaluation

## 2023-11-25 ENCOUNTER — Encounter (HOSPITAL_COMMUNITY): Payer: Self-pay | Admitting: Anesthesiology

## 2023-11-25 ENCOUNTER — Encounter (HOSPITAL_COMMUNITY)
Admission: RE | Disposition: A | Discharge status: Home / Self Care | Payer: Self-pay | Source: Home / Self Care | Attending: Gastroenterology

## 2023-11-25 ENCOUNTER — Ambulatory Visit (HOSPITAL_COMMUNITY): Payer: Self-pay | Admitting: Anesthesiology

## 2023-11-25 ENCOUNTER — Ambulatory Visit (HOSPITAL_COMMUNITY)
Admission: RE | Admit: 2023-11-25 | Discharge: 2023-11-25 | Disposition: A | Discharge status: Home / Self Care | Attending: Gastroenterology | Admitting: Gastroenterology

## 2023-11-25 ENCOUNTER — Other Ambulatory Visit: Payer: Self-pay

## 2023-11-25 ENCOUNTER — Encounter (HOSPITAL_COMMUNITY): Payer: Self-pay | Admitting: Gastroenterology

## 2023-11-25 DIAGNOSIS — K621 Rectal polyp: Secondary | ICD-10-CM | POA: Diagnosis not present

## 2023-11-25 DIAGNOSIS — M1990 Unspecified osteoarthritis, unspecified site: Secondary | ICD-10-CM | POA: Insufficient documentation

## 2023-11-25 DIAGNOSIS — D128 Benign neoplasm of rectum: Secondary | ICD-10-CM

## 2023-11-25 DIAGNOSIS — R152 Fecal urgency: Secondary | ICD-10-CM | POA: Diagnosis not present

## 2023-11-25 DIAGNOSIS — K649 Unspecified hemorrhoids: Secondary | ICD-10-CM | POA: Diagnosis not present

## 2023-11-25 DIAGNOSIS — R197 Diarrhea, unspecified: Secondary | ICD-10-CM | POA: Diagnosis not present

## 2023-11-25 DIAGNOSIS — I509 Heart failure, unspecified: Secondary | ICD-10-CM | POA: Diagnosis not present

## 2023-11-25 DIAGNOSIS — I110 Hypertensive heart disease with heart failure: Secondary | ICD-10-CM | POA: Insufficient documentation

## 2023-11-25 DIAGNOSIS — Z7902 Long term (current) use of antithrombotics/antiplatelets: Secondary | ICD-10-CM | POA: Diagnosis not present

## 2023-11-25 DIAGNOSIS — K219 Gastro-esophageal reflux disease without esophagitis: Secondary | ICD-10-CM | POA: Insufficient documentation

## 2023-11-25 DIAGNOSIS — E039 Hypothyroidism, unspecified: Secondary | ICD-10-CM | POA: Insufficient documentation

## 2023-11-25 HISTORY — PX: COLONOSCOPY: SHX5424

## 2023-11-25 SURGERY — COLONOSCOPY
Anesthesia: General

## 2023-11-25 MED ORDER — PROPOFOL 10 MG/ML IV BOLUS
INTRAVENOUS | Status: DC | PRN
  Administered 2023-11-25: 100 mg via INTRAVENOUS
  Administered 2023-11-25: 150 ug/kg/min via INTRAVENOUS

## 2023-11-25 MED ORDER — LACTATED RINGERS IV SOLN: INTRAVENOUS | Status: DC

## 2023-11-25 MED ORDER — LIDOCAINE HCL (CARDIAC) PF 100 MG/5ML IV SOSY
PREFILLED_SYRINGE | INTRAVENOUS | Status: DC | PRN
  Administered 2023-11-25: 50 mg via INTRAVENOUS

## 2023-11-25 MED ORDER — SPOT INK MARKER SYRINGE KIT
PACK | SUBMUCOSAL | Status: DC | PRN
  Administered 2023-11-25: 1 mL via SUBMUCOSAL

## 2023-11-25 NOTE — Anesthesia Postprocedure Evaluation (Signed)
 Anesthesia Post Note  Patient: Sarah Nelson  Procedure(s) Performed: COLONOSCOPY  Patient location during evaluation: Phase II Anesthesia Type: General Level of consciousness: awake and alert Pain management: pain level controlled Vital Signs Assessment: post-procedure vital signs reviewed and stable Respiratory status: spontaneous breathing, nonlabored ventilation and respiratory function stable Cardiovascular status: stable Anesthetic complications: no   There were no known notable events for this encounter.   Last Vitals:  Vitals:   11/25/23 0812 11/25/23 0819  BP: (!) 91/39 (!) 102/43  Pulse:    Resp:    Temp:    SpO2:      Last Pain:  Vitals:   11/25/23 0805  TempSrc: Oral  PainSc: 0-No pain                 Nekeshia Lenhardt L Taima Rada

## 2023-11-25 NOTE — Discharge Instructions (Signed)
You are being discharged to home.  Resume your previous diet.  We are waiting for your pathology results.  Your physician has recommended a repeat colonoscopy for surveillance based on pathology results.  Restart Plavix tonight.  

## 2023-11-25 NOTE — Op Note (Signed)
 [facility] Patient Name: Sarah Nelson Procedure Date: 11/25/2023 7:07 AM MRN: [id number] Date of Birth: 09-Nov-1935 Attending MD: Toribio Fortune , , [id number] CSN: [id number] Age: 88 Admit Type: Outpatient Procedure:                Colonoscopy Indications:              Diarrhea, fecal urgency Providers:                Toribio Fortune, Leandrew Edelman RN, RN, Bascom Blush Referring MD:              Medicines:                Monitored Anesthesia Care Complications:            No immediate complications. Estimated Blood Loss:     Estimated blood loss: none. Procedure:                Pre-Anesthesia Assessment:                           - Prior to the procedure, a History and Physical                            was performed, and patient medications, allergies                            and sensitivities were reviewed. The patient's                            tolerance of previous anesthesia was reviewed.                           - The risks and benefits of the procedure and the                            sedation options and risks were discussed with the                            patient. All questions were answered and informed                            consent was obtained.                           - ASA Grade Assessment: II - A patient with mild                            systemic disease.                           After obtaining informed consent, the colonoscope                            was passed under direct vision. Throughout the                            procedure, the patient's blood pressure,  pulse, and                            oxygen  saturations were monitored continuously. The                            [id number]-[id number] ([id number]) Peds Colon was introduced                            through the anus and advanced to the the cecum,                            identified by appendiceal orifice and ileocecal                            valve. The colonoscopy was  performed without                            difficulty. The patient tolerated the procedure                            well. The quality of the bowel preparation was                            excellent. Scope In: 7:37:14 AM Scope Out: 8:02:44 AM Scope Withdrawal Time: 0 hours 16 minutes 7 seconds  Total Procedure Duration: 0 hours 25 minutes 30 seconds  Findings:      Hemorrhoids were found on perianal exam.      The colon (entire examined portion) appeared normal. Biopsies for       histology were taken with a cold forceps from the right colon and left       colon for evaluation of microscopic colitis.      A 12 mm polyp was found in the rectum. The polyp was multi-lobulated.       The polyp was removed with a cold snare. Resection and retrieval were       complete. Area was successfully injected with 1 mL Spot (carbon black)       for tattooing.      The retroflexed view of the distal rectum and anal verge was normal and       showed no anal or rectal abnormalities. Impression:               - Hemorrhoids found on perianal exam.                           - The entire examined colon is normal. Biopsied.                           - One 12 mm polyp in the rectum, removed with a                            cold snare. Resected and retrieved. Injected.                           - The distal rectum and anal  verge are normal on                            retroflexion view. Moderate Sedation:      Per Anesthesia Care Recommendation:           - Discharge patient to home (ambulatory).                           - Resume previous diet.                           - Await pathology results.                           - Repeat colonoscopy for surveillance based on                            pathology results.                           -Restart Plavix  tonight. Procedure Code(s):        --- Professional ---                           [phone], Colonoscopy, flexible; with removal of                             tumor(s), polyp(s), or other lesion(s) by snare                            technique                           45380, 59, Colonoscopy, flexible; with biopsy,                            single or multiple                           45381, Colonoscopy, flexible; with directed                            submucosal injection(s), any substance Diagnosis Code(s):        --- Professional ---                           K64.9, Unspecified hemorrhoids                           D12.8, Benign neoplasm of rectum                           R19.7, Diarrhea, unspecified CPT copyright 2022 American [employer] Association. All rights reserved. The codes documented in this report are preliminary and upon coder review may  be revised to meet current compliance requirements. Toribio Fortune, MD Toribio Fortune,  11/25/2023 8:12:38 AM This report has been signed electronically. Number of Addenda: 0

## 2023-11-25 NOTE — Transfer of Care (Signed)
 Immediate Anesthesia Transfer of Care Note  Patient: Sarah Nelson  Procedure(s) Performed: COLONOSCOPY  Patient Location: PACU  Anesthesia Type:General  Level of Consciousness: drowsy, patient cooperative, and responds to stimulation  Airway & Oxygen  Therapy: Patient Spontanous Breathing and Patient connected to nasal cannula oxygen   Post-op Assessment: Report given to RN and Post -op Vital signs reviewed and stable  Post vital signs: Reviewed and stable  Last Vitals:  Vitals Value Taken Time  BP 100/36 11/25/23 08:05  Temp 37.1 C 11/25/23 08:05  Pulse 55 11/25/23 08:05  Resp 12 11/25/23 08:05  SpO2 100 % 11/25/23 08:05    Last Pain:  Vitals:   11/25/23 0805  TempSrc: Oral  PainSc: 0-No pain         Complications: No notable events documented.

## 2023-11-25 NOTE — H&P (Signed)
 Sarah Nelson is an 88 y.o. female.   Chief Complaint: fecal urgency. HPI: Sarah Nelson is a 88 y.o. female with history of hyperlipidemia, hypertension, stroke, who presents for evaluation of fecal urgency.  Patient reports that she is still still having intermmitent rectal urgency to have a BM, although this is not happening daily. The patient denies having any nausea, vomiting, fever, chills, hematochezia, melena, hematemesis, abdominal distention, abdominal pain, diarrhea, jaundice, pruritus or weight loss.   Past Medical History:  Diagnosis Date   Anxiety    Arthritis    Carotid artery occlusion    COVID-19 02/09/2019   GERD (gastroesophageal reflux disease)    High cholesterol    Hypertension    Hypothyroidism    Stroke (HCC) 05/2017   Thyroid  disease     Past Surgical History:  Procedure Laterality Date   APPENDECTOMY     cataracts Bilateral 2018   removed   COLONOSCOPY     LUMBAR FUSION  11/04/2018   MENISCUS REPAIR Right    ROTATOR CUFF REPAIR Right    TRANSCAROTID ARTERY REVASCULARIZATION  Right 06/09/2019   Procedure: TRANSCAROTID ARTERY REVASCULARIZATION;  Surgeon: Sheree Penne Bruckner, MD;  Location: [facility] OR;  Service: Vascular;  Laterality: Right;   ULTRASOUND GUIDANCE FOR VASCULAR ACCESS Left 06/09/2019   Procedure: Ultrasound Guidance For Vascular Access;  Surgeon: Sheree Penne Bruckner, MD;  Location: [facility] OR;  Service: Vascular;  Laterality: Left;    Family History  Problem Relation Age of Onset   Stroke Mother    Hypertension Mother    Cancer Father    Cancer Brother        brain tumor   Hypertension Paternal Grandmother    Diabetes Paternal Grandfather    Social History:  reports that she has never smoked. She has never used smokeless tobacco. She reports that she does not drink alcohol and does not use drugs.  Allergies: No Known Allergies  Medications Prior to Admission  Medication Sig Dispense Refill   amLODipine  (NORVASC )  10 MG tablet Take 5 mg by mouth at bedtime.  (Patient taking differently: Take 10 mg by mouth daily.)  5   busPIRone (BUSPAR) 5 MG tablet Take 5 mg by mouth 2 (two) times daily.     cloNIDine (CATAPRES) 0.1 MG tablet Take 0.1 mg by mouth as needed.     clopidogrel  (PLAVIX ) 75 MG tablet TAKE 1 TABLET BY MOUTH EVERY DAY 90 tablet 3   docusate sodium  (COLACE) 100 MG capsule Take 100 mg by mouth at bedtime.     hydrochlorothiazide (MICROZIDE) 12.5 MG capsule Take 12.5 mg by mouth daily.     levothyroxine  (SYNTHROID ) 125 MCG tablet Take 112 mcg by mouth daily before breakfast.  (Patient taking differently: Take 112 mcg by mouth daily before breakfast. 112 mcg four days per week, and 125 mcg three days out of the week.)     nebivolol  (BYSTOLIC ) 5 MG tablet Take 5 mg by mouth daily.     OVER THE COUNTER MEDICATION Take by mouth. AREDS one po bid  Vit D 3 (50 mcg daily)     pantoprazole  (PROTONIX ) 40 MG tablet Take 1 tablet (40 mg total) by mouth daily. 90 tablet 3   psyllium (METAMUCIL) 58.6 % packet Take 1 packet by mouth daily.     rosuvastatin  (CRESTOR ) 10 MG tablet Take 10 mg by mouth daily.      telmisartan (MICARDIS) 80 MG tablet Take 80 mg by mouth daily.  vitamin B-12 (CYANOCOBALAMIN) 100 MCG tablet Take 100 mcg by mouth daily. (Patient taking differently: Take 1,000 mcg by mouth daily.)      No results found for this or any previous visit (from the past 48 hours). No results found.  Review of Systems  All other systems reviewed and are negative.   Blood pressure (!) 131/56, pulse (!) 58, temperature 98.2 F (36.8 C), temperature source Oral, resp. rate 13, height 5' 6.5 (1.689 m), weight 68 kg, SpO2 98%. Physical Exam  GENERAL: The patient is AO x3, in no acute distress. HEENT: Head is normocephalic and atraumatic. EOMI are intact. Mouth is well hydrated and without lesions. NECK: Supple. No masses LUNGS: Clear to auscultation. No presence of rhonchi/wheezing/rales. Adequate chest  expansion HEART: RRR, normal s1 and s2. ABDOMEN: Soft, nontender, no guarding, no peritoneal signs, and nondistended. BS +. No masses. EXTREMITIES: Without any cyanosis, clubbing, rash, lesions or edema. NEUROLOGIC: AOx3, no focal motor deficit. SKIN: no jaundice, no rashes  Assessment/Plan Sarah MAUGERI is a 88 y.o. female with history of hyperlipidemia, hypertension, stroke, who presents for evaluation of fecal urgency.  Will proceed with colonoscopy.  Toribio Eartha Flavors, MD 11/25/2023, 7:30 AM

## 2023-11-27 LAB — SURGICAL PATHOLOGY

## 2023-11-28 ENCOUNTER — Ambulatory Visit (INDEPENDENT_AMBULATORY_CARE_PROVIDER_SITE_OTHER): Payer: Self-pay | Admitting: Gastroenterology

## 2023-11-28 ENCOUNTER — Encounter (HOSPITAL_COMMUNITY): Payer: Self-pay | Admitting: Gastroenterology

## 2023-11-28 DIAGNOSIS — K529 Noninfective gastroenteritis and colitis, unspecified: Secondary | ICD-10-CM

## 2023-11-28 MED ORDER — CHOLESTYRAMINE 4 G PO PACK: 4.0000 g | PACK | Freq: Every day | ORAL | 1 refills | Status: AC

## 2023-12-01 NOTE — Progress Notes (Signed)
 Patient result letter mailed

## 2023-12-03 ENCOUNTER — Encounter (INDEPENDENT_AMBULATORY_CARE_PROVIDER_SITE_OTHER): Payer: Self-pay | Admitting: Gastroenterology

## 2023-12-26 ENCOUNTER — Other Ambulatory Visit: Payer: Self-pay | Admitting: Vascular Surgery

## 2024-01-14 ENCOUNTER — Ambulatory Visit (INDEPENDENT_AMBULATORY_CARE_PROVIDER_SITE_OTHER): Admitting: Gastroenterology

## 2024-01-14 ENCOUNTER — Encounter (INDEPENDENT_AMBULATORY_CARE_PROVIDER_SITE_OTHER): Payer: Self-pay | Admitting: Gastroenterology

## 2024-01-14 VITALS — BP 136/73 | HR 65 | Temp 97.7°F | Ht 66.5 in | Wt 157.5 lb

## 2024-01-14 DIAGNOSIS — R1310 Dysphagia, unspecified: Secondary | ICD-10-CM | POA: Diagnosis not present

## 2024-01-14 DIAGNOSIS — K219 Gastro-esophageal reflux disease without esophagitis: Secondary | ICD-10-CM | POA: Diagnosis not present

## 2024-01-14 DIAGNOSIS — R152 Fecal urgency: Secondary | ICD-10-CM

## 2024-01-14 DIAGNOSIS — K529 Noninfective gastroenteritis and colitis, unspecified: Secondary | ICD-10-CM | POA: Insufficient documentation

## 2024-01-14 NOTE — Progress Notes (Signed)
 Shawntez Dickison Faizan Hassani Sliney , M.D. Gastroenterology & Hepatology [facility] Gastroenterology [address] [place], [place] [address] Primary Care Physician: Shona Norleen PEDLAR, MD [address] [place] [address]   History of Present Illness: Sarah Nelson is a 89 y.o. female with hyperlipidemia, hypertension, stroke, who presents for follow up for fecal urgency and dysphagia  Patient was last seen by Dr. Eartha 10/2023 where she underwent colonoscopy.  Stool testing was negative for infection.  Patient was started on cholestyramine  and pantoprazole  Today patient reports symptoms of fecal urgency and diarrhea has completely resolved after starting cholestyramine .  Also previously reported dysphagia which was intermittent and acid reflux has also completely resolved after starting Protonix . The patient denies having any nausea, vomiting, fever, chills, hematochezia, melena, hematemesis, abdominal distention, abdominal pain, diarrhea, jaundice, pruritus or weight loss.  Last ZHI:wnwz Last Colonoscopy:11/2023  - Hemorrhoids found on perianal exam. - The entire examined colon is normal. Biopsied. - One 12 mm polyp in the rectum, removed with a cold snare. Resected and retrieved. Injected. - The distal rectum and anal verge are normal on retroflexion view.  A. COLON, RANDOM, BIOPSY:  - Benign colonic mucosa with no significant pathologic changes  - Negative for increased intraepithelial lymphocytes or thickened  subepithelial collagen table  - Negative for dysplasia or malignancy   B. RECTAL, POLYPECTOMY:  - Traditional serrated adenoma   No further colonoscopies recommended   Past Medical History: Past Medical History:  Diagnosis Date   Anxiety    Arthritis    Carotid artery occlusion    COVID-19 02/09/2019   GERD (gastroesophageal reflux disease)    High cholesterol    Hypertension    Hypothyroidism    Stroke (HCC) 05/2017   Thyroid  disease      Past Surgical History: Past Surgical History:  Procedure Laterality Date   APPENDECTOMY     cataracts Bilateral 2018   removed   COLONOSCOPY     COLONOSCOPY N/A 11/25/2023   Procedure: COLONOSCOPY;  Surgeon: Eartha Angelia Sieving, MD;  Location[facility];  Service: Gastroenterology;  Laterality: N/A;  7:30 am, asa 3   LUMBAR FUSION  11/04/2018   MENISCUS REPAIR Right    ROTATOR CUFF REPAIR Right    TRANSCAROTID ARTERY REVASCULARIZATION  Right 06/09/2019   Procedure: TRANSCAROTID ARTERY REVASCULARIZATION;  Surgeon: Sheree Penne Bruckner, MD;  Location: [facility] OR;  Service: Vascular;  Laterality: Right;   ULTRASOUND GUIDANCE FOR VASCULAR ACCESS Left 06/09/2019   Procedure: Ultrasound Guidance For Vascular Access;  Surgeon: Sheree Penne Bruckner, MD;  Location: [facility] OR;  Service: Vascular;  Laterality: Left;    Family History: Family History  Problem Relation Age of Onset   Stroke Mother    Hypertension Mother    Cancer Father    Cancer Brother        brain tumor   Hypertension Paternal Grandmother    Diabetes Paternal Grandfather     Social History:Tobacco Use History[1] Social History   Substance and Sexual Activity  Alcohol Use Never   Social History   Substance and Sexual Activity  Drug Use Never    Allergies: Allergies[2]  Medications: Current Outpatient Medications  Medication Sig Dispense Refill   busPIRone (BUSPAR) 5 MG tablet Take 5 mg by mouth 2 (two) times daily.     cholestyramine  (QUESTRAN ) 4 g packet Take 1 packet (4 g total) by mouth daily. At noon time, at least 4 hours apart from other medications 90 each 1  clopidogrel  (PLAVIX ) 75 MG tablet TAKE 1 TABLET BY MOUTH EVERY DAY 90 tablet 3   docusate sodium  (COLACE) 100 MG capsule Take 100 mg by mouth at bedtime.     hydrochlorothiazide (MICROZIDE) 12.5 MG capsule Take 12.5 mg by mouth daily.     levothyroxine  (SYNTHROID ) 125 MCG tablet Take 112 mcg by mouth daily before breakfast.   (Patient taking differently: Take 112 mcg by mouth daily before breakfast. 112 mcg four days per week, and 125 mcg three days out of the week.)     nebivolol  (BYSTOLIC ) 5 MG tablet Take 5 mg by mouth daily.     OVER THE COUNTER MEDICATION Take by mouth. AREDS one po bid  Vit D 3 (50 mcg daily)     pantoprazole  (PROTONIX ) 40 MG tablet Take 1 tablet (40 mg total) by mouth daily. 90 tablet 3   rosuvastatin  (CRESTOR ) 10 MG tablet Take 10 mg by mouth daily.      telmisartan (MICARDIS) 80 MG tablet Take 80 mg by mouth daily.     vitamin B-12 (CYANOCOBALAMIN) 100 MCG tablet Take 100 mcg by mouth daily. (Patient taking differently: Take 1,000 mcg by mouth daily.)     amLODipine  (NORVASC ) 10 MG tablet Take 5 mg by mouth at bedtime.  (Patient taking differently: Take 10 mg by mouth daily.)  5   cloNIDine (CATAPRES) 0.1 MG tablet Take 0.1 mg by mouth as needed.     psyllium (METAMUCIL) 58.6 % packet Take 1 packet by mouth daily.     No current facility-administered medications for this visit.    Review of Systems: GENERAL: negative for malaise, night sweats HEENT: No changes in hearing or vision, no nose bleeds or other nasal problems. NECK: Negative for lumps, goiter, pain and significant neck swelling RESPIRATORY: Negative for cough, wheezing CARDIOVASCULAR: Negative for chest pain, leg swelling, palpitations, orthopnea GI: SEE HPI MUSCULOSKELETAL: Negative for joint pain or swelling, back pain, and muscle pain. SKIN: Negative for lesions, rash HEMATOLOGY Negative for prolonged bleeding, bruising easily, and swollen nodes. ENDOCRINE: Negative for cold or heat intolerance, polyuria, polydipsia and goiter. NEURO: negative for tremor, gait imbalance, syncope and seizures. The remainder of the review of systems is noncontributory.   Physical Exam: BP 136/73   Pulse 65   Temp 97.7 F (36.5 C)   Ht 5' 6.5 (1.689 m)   Wt 157 lb 8 oz (71.4 kg)   BMI 25.04 kg/m  GENERAL: The patient is AO x3,  in no acute distress. HEENT: Head is normocephalic and atraumatic. EOMI are intact. Mouth is well hydrated and without lesions. NECK: Supple. No masses LUNGS: Clear to auscultation. No presence of rhonchi/wheezing/rales. Adequate chest expansion HEART: RRR, normal s1 and s2. ABDOMEN: Soft, nontender, no guarding, no peritoneal signs, and nondistended. BS +. No masses.  Imaging/Labs: as above     Latest Ref Rng & Units 03/17/2023    5:13 PM 06/10/2019    4:58 AM 06/09/2019   11:00 AM  CBC  WBC 4.0 - 10.5 K/uL 5.4  7.5  5.4   Hemoglobin 12.0 - 15.0 g/dL 86.1  9.9  88.9   Hematocrit 36.0 - 46.0 % 42.7  31.6  35.2   Platelets 150 - 400 K/uL 242  280  267    No results found for: IRON, TIBC, FERRITIN  I personally reviewed and interpreted the available labs, imaging and endoscopic files.  Impression and Plan:  Sarah Nelson is a 89 y.o. female with hyperlipidemia, hypertension, stroke, who  presents for follow up for fecal urgency and dysphagia  #Diarrhea #GERD/Dysphagia #Colon polyp  Patient diarrhea is completely resolved after starting cholestyramine  and this is likely bile acid diarrhea.  Recommend continuing this 4 hours apart from her medications  Dysphagia and GERD has significantly improved after starting Protonix  which I recommend patient to continue  Although dysphagia is considered alarm symptoms and with advanced age , I have discussed with patient clinical indication for upper endoscopy to rule out any underlying malignancy; she would like to defer and revisit this in future  Continue Protonix  Continue cholestyramine  Patient will let us  know if she changes her mind about pursuing upper endoscopy  All questions were answered.      Zayne Marovich Faizan Skyann Ganim, MD Gastroenterology and Hepatology [facility] Gastroenterology   This chart has been completed using [facility] Dictation software, and while attempts have been made to ensure accuracy ,  certain words and phrases may not be transcribed as intended      [1]  Social History Tobacco Use  Smoking Status Never  Smokeless Tobacco Never  [2] No Known Allergies
